# Patient Record
Sex: Female | Born: 1975 | Race: Black or African American | Hispanic: No | Smoking: Former smoker
Health system: Southern US, Community
[De-identification: ages and names within clinical notes are randomized; demographics above are authoritative.]

## PROBLEM LIST (undated history)

## (undated) DIAGNOSIS — D571 Sickle-cell disease without crisis: Secondary | ICD-10-CM

## (undated) DIAGNOSIS — F419 Anxiety disorder, unspecified: Secondary | ICD-10-CM

## (undated) DIAGNOSIS — D649 Anemia, unspecified: Secondary | ICD-10-CM

## (undated) HISTORY — PX: FOOT SURGERY: SHX648

## (undated) HISTORY — PX: OTHER SURGICAL HISTORY: SHX169

---

## 2004-06-10 ENCOUNTER — Inpatient Hospital Stay (HOSPITAL_COMMUNITY): Admission: AD | Admit: 2004-06-10 | Discharge: 2004-06-11 | Payer: Self-pay | Admitting: *Deleted

## 2004-06-10 ENCOUNTER — Emergency Department (HOSPITAL_COMMUNITY): Admission: EM | Admit: 2004-06-10 | Discharge: 2004-06-10 | Payer: Self-pay

## 2004-06-19 ENCOUNTER — Emergency Department (HOSPITAL_COMMUNITY): Admission: EM | Admit: 2004-06-19 | Discharge: 2004-06-19 | Payer: Self-pay | Admitting: Family Medicine

## 2004-09-09 ENCOUNTER — Emergency Department (HOSPITAL_COMMUNITY): Admission: EM | Admit: 2004-09-09 | Discharge: 2004-09-09 | Payer: Self-pay | Admitting: Emergency Medicine

## 2005-03-27 ENCOUNTER — Emergency Department (HOSPITAL_COMMUNITY): Admission: EM | Admit: 2005-03-27 | Discharge: 2005-03-27 | Payer: Self-pay | Admitting: Family Medicine

## 2005-06-05 ENCOUNTER — Other Ambulatory Visit: Admission: RE | Admit: 2005-06-05 | Discharge: 2005-06-05 | Payer: Self-pay | Admitting: Obstetrics and Gynecology

## 2005-06-18 ENCOUNTER — Other Ambulatory Visit: Admission: RE | Admit: 2005-06-18 | Discharge: 2005-06-18 | Payer: Self-pay | Admitting: Obstetrics and Gynecology

## 2005-11-26 ENCOUNTER — Other Ambulatory Visit: Admission: RE | Admit: 2005-11-26 | Discharge: 2005-11-26 | Payer: Self-pay | Admitting: Obstetrics and Gynecology

## 2006-03-17 ENCOUNTER — Other Ambulatory Visit: Admission: RE | Admit: 2006-03-17 | Discharge: 2006-03-17 | Payer: Self-pay | Admitting: Obstetrics and Gynecology

## 2006-06-21 ENCOUNTER — Other Ambulatory Visit: Admission: RE | Admit: 2006-06-21 | Discharge: 2006-06-21 | Payer: Self-pay | Admitting: Obstetrics and Gynecology

## 2006-10-04 ENCOUNTER — Emergency Department (HOSPITAL_COMMUNITY): Admission: EM | Admit: 2006-10-04 | Discharge: 2006-10-04 | Payer: Self-pay | Admitting: *Deleted

## 2006-10-06 ENCOUNTER — Emergency Department (HOSPITAL_COMMUNITY): Admission: EM | Admit: 2006-10-06 | Discharge: 2006-10-06 | Payer: Self-pay | Admitting: Emergency Medicine

## 2006-10-28 ENCOUNTER — Encounter: Admission: RE | Admit: 2006-10-28 | Discharge: 2006-10-28 | Payer: Self-pay | Admitting: Family Medicine

## 2007-07-22 ENCOUNTER — Inpatient Hospital Stay (HOSPITAL_COMMUNITY): Admission: AD | Admit: 2007-07-22 | Discharge: 2007-07-22 | Payer: Self-pay | Admitting: Obstetrics and Gynecology

## 2007-08-02 ENCOUNTER — Ambulatory Visit (HOSPITAL_COMMUNITY): Admission: RE | Admit: 2007-08-02 | Discharge: 2007-08-02 | Payer: Self-pay | Admitting: Obstetrics and Gynecology

## 2007-09-08 ENCOUNTER — Inpatient Hospital Stay (HOSPITAL_COMMUNITY): Admission: AD | Admit: 2007-09-08 | Discharge: 2007-09-08 | Payer: Self-pay | Admitting: Obstetrics and Gynecology

## 2007-09-09 ENCOUNTER — Ambulatory Visit: Payer: Self-pay | Admitting: Cardiology

## 2007-09-16 ENCOUNTER — Ambulatory Visit: Payer: Self-pay

## 2007-11-03 ENCOUNTER — Inpatient Hospital Stay (HOSPITAL_COMMUNITY): Admission: AD | Admit: 2007-11-03 | Discharge: 2007-11-03 | Payer: Self-pay | Admitting: Obstetrics and Gynecology

## 2007-11-15 ENCOUNTER — Inpatient Hospital Stay (HOSPITAL_COMMUNITY): Admission: AD | Admit: 2007-11-15 | Discharge: 2007-11-15 | Payer: Self-pay | Admitting: Obstetrics and Gynecology

## 2007-11-28 ENCOUNTER — Inpatient Hospital Stay (HOSPITAL_COMMUNITY): Admission: AD | Admit: 2007-11-28 | Discharge: 2007-11-28 | Payer: Self-pay | Admitting: Obstetrics and Gynecology

## 2007-12-07 ENCOUNTER — Inpatient Hospital Stay (HOSPITAL_COMMUNITY): Admission: AD | Admit: 2007-12-07 | Discharge: 2007-12-10 | Payer: Self-pay | Admitting: Obstetrics and Gynecology

## 2009-01-23 ENCOUNTER — Inpatient Hospital Stay (HOSPITAL_COMMUNITY): Admission: AD | Admit: 2009-01-23 | Discharge: 2009-01-23 | Payer: Self-pay | Admitting: Obstetrics & Gynecology

## 2010-01-09 ENCOUNTER — Encounter: Admission: RE | Admit: 2010-01-09 | Discharge: 2010-01-09 | Payer: Self-pay | Admitting: Obstetrics and Gynecology

## 2010-12-04 ENCOUNTER — Emergency Department (HOSPITAL_COMMUNITY)
Admission: EM | Admit: 2010-12-04 | Discharge: 2010-12-04 | Payer: Self-pay | Source: Home / Self Care | Admitting: Emergency Medicine

## 2010-12-05 ENCOUNTER — Ambulatory Visit (HOSPITAL_COMMUNITY)
Admission: RE | Admit: 2010-12-05 | Discharge: 2010-12-05 | Payer: Self-pay | Source: Home / Self Care | Attending: Obstetrics and Gynecology | Admitting: Obstetrics and Gynecology

## 2011-01-04 ENCOUNTER — Encounter: Payer: Self-pay | Admitting: Obstetrics and Gynecology

## 2011-02-23 LAB — DIFFERENTIAL
Basophils Absolute: 0 10*3/uL (ref 0.0–0.1)
Basophils Relative: 1 % (ref 0–1)
Eosinophils Absolute: 0.1 10*3/uL (ref 0.0–0.7)
Eosinophils Relative: 2 % (ref 0–5)
Neutrophils Relative %: 54 % (ref 43–77)

## 2011-02-23 LAB — POCT I-STAT, CHEM 8
HCT: 33 % — ABNORMAL LOW (ref 36.0–46.0)
Hemoglobin: 11.2 g/dL — ABNORMAL LOW (ref 12.0–15.0)
Potassium: 3.4 mEq/L — ABNORMAL LOW (ref 3.5–5.1)
Sodium: 137 mEq/L (ref 135–145)
TCO2: 24 mmol/L (ref 0–100)

## 2011-02-23 LAB — SURGICAL PCR SCREEN
MRSA, PCR: NEGATIVE
Staphylococcus aureus: NEGATIVE

## 2011-02-23 LAB — URINALYSIS, ROUTINE W REFLEX MICROSCOPIC
Bilirubin Urine: NEGATIVE
Glucose, UA: NEGATIVE mg/dL
Hgb urine dipstick: NEGATIVE
Specific Gravity, Urine: 1.012 (ref 1.005–1.030)
Urobilinogen, UA: 1 mg/dL (ref 0.0–1.0)

## 2011-02-23 LAB — CBC
MCH: 28.7 pg (ref 26.0–34.0)
MCHC: 34.5 g/dL (ref 30.0–36.0)
Platelets: 201 10*3/uL (ref 150–400)
RBC: 3.9 MIL/uL (ref 3.87–5.11)
RDW: 13.1 % (ref 11.5–15.5)

## 2011-02-23 LAB — WET PREP, GENITAL
Trich, Wet Prep: NONE SEEN
Yeast Wet Prep HPF POC: NONE SEEN

## 2011-02-23 LAB — RPR: RPR Ser Ql: NONREACTIVE

## 2011-02-23 LAB — GC/CHLAMYDIA PROBE AMP, GENITAL: Chlamydia, DNA Probe: NEGATIVE

## 2011-04-28 NOTE — Assessment & Plan Note (Signed)
[facility]                            CARDIOLOGY OFFICE NOTE   NAME:HEYLIGERRahcel, Natasha Huff                     MRN:          [id number]  DATE:09/09/2007                            DOB:          11-29-1976    I was asked by Dr. Dierdre Forth to evaluate and consult on [name] for chest pain and shortness of breath over the last 3 weeks.   She is about [redacted] weeks pregnant.   HISTORY OF PRESENT ILLNESS:  She is 35 years of age, has 3 children and  is pregnant. She has a history remotely of being in the hospital in [place] for low potassium and irregular heart beat. Details are not  known.   She has never had any valvular disease or rheumatic heart disease.   Over the last 3 weeks she has noticed dyspnea on exertion. She has also  had some sharp stabbing chest pain substernally.   She denies any true orthopnea, PND, but has had some peripheral edema at  the end of the day.   She saw Dr. Pennie Rushing in the [facility] yesterday. Her potassium was  3.3, hemoglobin 11.0, creatinine and renal function normal, LFTs normal  except for her albumin is a little bit low.   I do not think she eats very well.   PAST MEDICAL HISTORY:  She is not allergic to any medications.   CURRENT MEDICATIONS:  Zoloft, Zyrtec, and prenatal vitamins.   She quit smoking in February of 2008. She does not use recreational  drugs. She does not drink any alcohol right now.   Her past medical history is also significant for sickle cell trait.  Apparently her mother had sickle cell.   FAMILY HISTORY:  Negative for premature coronary disease.   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   REVIEW OF SYSTEMS:  She has had chronic anemia, allergies and hay fever,  and chronic anxiety.   PHYSICAL EXAMINATION:  Blood pressure is 112/72, her pulse is 93 and  regular. Her EKG is abnormal, expect for sinus tachycardia. The EKG from  the [facility] was  also similar. Her height is not given. Her weight  is 153.  HEENT: Normocephalic, atraumatic. PERRLA: Extraocular movements intact.  Sclera clear. Facial symmetry is normal. She is in no acute distress.  SKIN: Warm and dry.  Respiratory is 18 and unlabored.  NECK: Shows no obvious JVD. Thyroid is palpable but nontender. Trachea  is midline. Carotid upstrokes are equal bilateral without bruits.  LUNGS: Clear to auscultation.  HEART: Reveals a non displaced PMI. She has a normal S1, S2 without  click. There is a soft systolic murmur along the left sternal border. S2  splits physiologically. There is no diastolic murmur.  ABDOMINAL: Soft, good bowel sounds. She is gravida.  EXTREMITIES: Reveal really no edema. Pulses are intact.  NEURO EXAM: Grossly intact.  Skin is unremarkable.   ASSESSMENT/PLAN:  1. Dyspnea on exertion. This is most likely just secondary to anemia,      some poor nutrition, and being pregnant. She probably was not  in      great condition prior to being pregnant.  2. Hypokalemia which seems to be a chronic problem.  3. Non cardiac chest pain.   RECOMMENDATIONS:  1. Improve dietary protein.  2. Increase potassium rich foods which were reviewed.  3. A 2D echocardiogram to rule out any structural heart disease.   We will see her back in a couple of weeks to make sure she is doing  well.     Thomas C. Daleen Squibb, MD, [facility]  Electronically Signed    TCW/MedQ  DD: 09/09/2007  DT: 09/09/2007  Job #: [id number]   cc:   Hal Morales, M.D.

## 2011-04-28 NOTE — H&P (Signed)
[name], [name] NO.:  [id number]   MEDICAL RECORD NO.:  [id number]          PATIENT TYPE:  INP   LOCATION:  9166                          FACILITY:  [facility]   PHYSICIAN:  Osborn Coho, M.D.   DATE OF BIRTH:  21-Jul-1976   DATE OF ADMISSION:  12/07/2007  DATE OF DISCHARGE:                              HISTORY & PHYSICAL   This is a 35 year old gravida 4, para 3-0-0-3, at 38-1/7 weeks, who  presents for induction of labor secondary to chronic back pain  unresponsive to treatment by a spine specialist and physical therapy.  She also has suspected LGA.  Pregnancy has been followed by Dr. Su Hilt,  remarkable for:   1. Has sickle cell trait.  2. History of abnormal Pap  3. Son with closed spina bifida.  4. History of anxiety.  5. Previous smoker.   ALLERGIES:  None.   OB HISTORY:  Remarkable for vaginal delivery in 1998 of a female at 59  weeks' gestation weighing 6 pounds 14 ounces.  She had a vaginal  delivery in 1999 of a female at 24 weeks' gestation weighing 6 pounds 15  ounces.  She had a vaginal delivery in 2003 of a female at 18 weeks'  gestation weighing 6 pounds 15 ounces, remarkable for closed spina  bifida.   MEDICAL HISTORY:  1. History of anemia.  2. History of abnormal Pap.  3. Childhood varicella.  4. History of anxiety and depression.   SURGICAL HISTORY:  Remarkable for a bunion removal and nasal surgery.   FAMILY HISTORY:  Remarkable for grandfather with heart disease.  Sister  with a thromboembolism.  Aunts with thyroid problems.  Son with  seizures.  Grandfather with Alzheimer's.   GENETIC HISTORY:  Remarkable for son with spina bifida, patient with  sickle cell trait, and mother and sons with sickle cell trait.  Her  mother has sickle cell disease.  Has a family history of twins.   SOCIAL HISTORY:  The patient is engaged to [employer], who is involved  and supportive.  She does not report a religious affiliation.  She  denies any  alcohol, tobacco or drug use.   PRENATAL LABS:  Hemoglobin 12.6, platelets 229.  Blood type is O+.  Sickle cell trait positive.  RPR nonreactive.  Rubella immune.  Hepatitis negative.  HIV negative.  Pap test normal.  Cystic fibrosis  negative.  GC and chlamydia both positive at new OB.   HISTORY OF CURRENT PREGNANCY:  The patient entered care at 10 weeks'  gestation.  She had positive chlamydia and gonorrhea at new OB, which  were both treated.  She had first trimester screen that was normal.  Test of cure for [facility] and chlamydia were both negative.  Ultrasound at 18  weeks was normal except for bilateral choroid plexus cysts.  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  She had  a 2-D fetal echo at 26 weeks that was normal.  Ultrasound at 30 weeks  was normal except for growth greater  than 90th percentile.  She used  some Zoloft for her mental health issues and stopped them late in  pregnancy.  Group B strep was negative at term.  She was seen at 35  weeks for sciatic back pain.  Ultrasound was done that time showing EFW  of 99th percentile and AFI at the 90th percentile.  She presents today  for induction of labor.   OBJECTIVE:  VITAL SIGNS:  Stable, afebrile.  HEENT:  Within normal limits.  Thyroid normal and not enlarged.  CHEST:  Clear to auscultation.  HEART:  Regular rate and rhythm.  ABDOMEN:  Gravid, vertex to [place].  EFM shows reactive fetal heart rate with no contractions.  Cervix is closed to fingertip, 70%, -3, with vertex presentation.  EXTREMITIES:  Within normal limits.   ASSESSMENT:  1. Intrauterine pregnancy at 38-1/7 weeks.  2. Induction of labor for back pain and large for gestational age.   PLAN:  1. Admit to birthing suites per Dr. Su Hilt.  2. Routine MD orders.  3. Pitocin.      Natasha Huff, C.N.M.      Osborn Coho, M.D.  Electronically  Signed    MLW/MEDQ  D:  12/07/2007  T:  12/07/2007  Job:  [id number]

## 2011-05-28 ENCOUNTER — Emergency Department (HOSPITAL_COMMUNITY)
Admission: EM | Admit: 2011-05-28 | Discharge: 2011-05-28 | Disposition: A | Discharge status: Home / Self Care | Payer: No Typology Code available for payment source | Attending: Emergency Medicine | Admitting: Emergency Medicine

## 2011-05-28 DIAGNOSIS — M542 Cervicalgia: Secondary | ICD-10-CM | POA: Insufficient documentation

## 2011-05-28 DIAGNOSIS — F411 Generalized anxiety disorder: Secondary | ICD-10-CM | POA: Insufficient documentation

## 2011-05-28 DIAGNOSIS — S139XXA Sprain of joints and ligaments of unspecified parts of neck, initial encounter: Secondary | ICD-10-CM | POA: Insufficient documentation

## 2011-05-28 DIAGNOSIS — Y929 Unspecified place or not applicable: Secondary | ICD-10-CM | POA: Insufficient documentation

## 2011-05-28 DIAGNOSIS — M2560 Stiffness of unspecified joint, not elsewhere classified: Secondary | ICD-10-CM | POA: Insufficient documentation

## 2011-05-28 DIAGNOSIS — D571 Sickle-cell disease without crisis: Secondary | ICD-10-CM | POA: Insufficient documentation

## 2011-09-18 LAB — CBC
HCT: 26.9 — ABNORMAL LOW
Hemoglobin: 9.3 — ABNORMAL LOW
MCHC: 33.4
MCV: 84.3
MCV: 85
Platelets: 142 — ABNORMAL LOW
RBC: 3.29 — ABNORMAL LOW
RDW: 16.5 — ABNORMAL HIGH
RDW: 17 — ABNORMAL HIGH
WBC: 8.7

## 2011-09-18 LAB — URINALYSIS, ROUTINE W REFLEX MICROSCOPIC
Bilirubin Urine: NEGATIVE
Hgb urine dipstick: NEGATIVE
Ketones, ur: NEGATIVE
Nitrite: NEGATIVE
Urobilinogen, UA: 0.2
pH: 6.5

## 2011-09-21 LAB — URINALYSIS, ROUTINE W REFLEX MICROSCOPIC
Bilirubin Urine: NEGATIVE
Ketones, ur: NEGATIVE
Nitrite: NEGATIVE
Protein, ur: NEGATIVE
Urobilinogen, UA: 0.2
pH: 6.5

## 2011-09-21 LAB — STREP B DNA PROBE

## 2011-09-21 LAB — CBC
Platelets: 150
RBC: 3.23 — ABNORMAL LOW
WBC: 6.5

## 2011-09-22 LAB — URINALYSIS, ROUTINE W REFLEX MICROSCOPIC
Nitrite: NEGATIVE
Specific Gravity, Urine: <1.005 — ABNORMAL LOW
pH: 6

## 2011-09-22 LAB — GC/CHLAMYDIA PROBE AMP, GENITAL
Chlamydia, DNA Probe: NEGATIVE
GC Probe Amp, Genital: NEGATIVE

## 2011-09-22 LAB — FETAL FIBRONECTIN: Fetal Fibronectin: NEGATIVE

## 2011-09-22 LAB — WET PREP, GENITAL: Yeast Wet Prep HPF POC: NONE SEEN

## 2011-09-24 LAB — DIFFERENTIAL
Basophils Absolute: 0
Basophils Relative: 0
Monocytes Relative: 6
Neutro Abs: 7.3
Neutrophils Relative %: 79 — ABNORMAL HIGH

## 2011-09-24 LAB — COMPREHENSIVE METABOLIC PANEL
Alkaline Phosphatase: 59
BUN: 1 — ABNORMAL LOW
Glucose, Bld: 98
Potassium: 3.3 — ABNORMAL LOW
Total Protein: 5.8 — ABNORMAL LOW

## 2011-09-24 LAB — CBC
HCT: 31.8 — ABNORMAL LOW
Hemoglobin: 11 — ABNORMAL LOW
MCHC: 34.7
MCV: 92.5
RDW: 13.8

## 2011-09-25 LAB — TISSUE HYBRIDIZATION TO NCBH

## 2011-09-28 LAB — URINALYSIS, ROUTINE W REFLEX MICROSCOPIC
Ketones, ur: NEGATIVE
Nitrite: NEGATIVE
Protein, ur: NEGATIVE
Urobilinogen, UA: 0.2

## 2012-03-24 ENCOUNTER — Encounter: Payer: Self-pay | Admitting: Obstetrics and Gynecology

## 2012-03-24 ENCOUNTER — Other Ambulatory Visit: Payer: Self-pay | Admitting: Obstetrics and Gynecology

## 2012-03-24 ENCOUNTER — Ambulatory Visit (INDEPENDENT_AMBULATORY_CARE_PROVIDER_SITE_OTHER): Payer: PRIVATE HEALTH INSURANCE | Admitting: Obstetrics and Gynecology

## 2012-03-24 VITALS — BP 104/70 | HR 72 | Resp 16 | Ht 67.0 in | Wt 132.0 lb

## 2012-03-24 DIAGNOSIS — Z01419 Encounter for gynecological examination (general) (routine) without abnormal findings: Secondary | ICD-10-CM

## 2012-03-24 DIAGNOSIS — Z8619 Personal history of other infectious and parasitic diseases: Secondary | ICD-10-CM

## 2012-03-24 DIAGNOSIS — Z975 Presence of (intrauterine) contraceptive device: Secondary | ICD-10-CM

## 2012-03-24 DIAGNOSIS — Z124 Encounter for screening for malignant neoplasm of cervix: Secondary | ICD-10-CM

## 2012-03-24 LAB — POCT WET PREP (WET MOUNT): Trichomonas Wet Prep HPF POC: NEGATIVE

## 2012-03-24 MED ORDER — VALACYCLOVIR HCL 500 MG PO TABS: 500.0000 mg | ORAL_TABLET | Freq: Two times a day (BID) | ORAL | Status: AC | PRN

## 2012-03-24 NOTE — Progress Notes (Signed)
No complaints excepts questions if she still has BV  Filed Vitals:   03/24/12 1616  BP: 104/70  Pulse: 72  Resp: 16    General - no distress Neck - No lymphadenopathy or enlarged thyroid Chest - clear to auscultation, no wheezes, rales or rhonchi, symmetric air entry Heart - normal rate and regular rhythm Abdomen - soft, nontender, nondistended, no masses or organomegaly Breasts - breasts appear normal, no suspicious masses, no skin or nipple changes or axillary nodes Pelvic - normal external genitalia, vulva, vagina, cervix, uterus and adnexa, no abnl d/c, IUD string in place (due to come out end of year) Extremities - no edema, redness or tenderness in the calves or thighs  Results for orders placed in visit on 03/24/12  POCT WET PREP (WET MOUNT)      Component Value Range   Source Wet Prep POC vaginal     WBC, Wet Prep HPF POC       Bacteria Wet Prep HPF POC neg     BACTERIA WET PREP MORPHOLOGY POC       Clue Cells Wet Prep HPF POC None     CLUE CELLS WET PREP WHIFF POC Negative Whiff     Yeast Wet Prep HPF POC None     KOH Wet Prep POC       Trichomonas Wet Prep HPF POC neg     pH 5.0      A/P STD testing (declines HSV secondary + history) Elevated ph - trial of rephresh Recheck vit D secondary to h/o vit D deficiency Pt considering BTL or another IUD Valtrex prn for HSV 2 RTO prn or for AEX

## 2012-03-25 LAB — HEPATITIS B SURFACE ANTIGEN: Hepatitis B Surface Ag: NEGATIVE

## 2012-03-25 LAB — VITAMIN D 25 HYDROXY (VIT D DEFICIENCY, FRACTURES): Vit D, 25-Hydroxy: 29 ng/mL — ABNORMAL LOW (ref 30–89)

## 2012-03-25 LAB — HIV ANTIBODY (ROUTINE TESTING W REFLEX): HIV: NONREACTIVE

## 2012-03-25 NOTE — Progress Notes (Signed)
Addended by: Marla Roe A on: 03/25/2012 10:59 AM   Modules accepted: Orders

## 2012-04-08 ENCOUNTER — Other Ambulatory Visit: Payer: Self-pay

## 2012-04-08 ENCOUNTER — Telehealth: Payer: Self-pay

## 2012-04-08 DIAGNOSIS — A6000 Herpesviral infection of urogenital system, unspecified: Secondary | ICD-10-CM

## 2012-04-08 MED ORDER — VALACYCLOVIR HCL 500 MG PO TABS: 500.0000 mg | ORAL_TABLET | Freq: Two times a day (BID) | ORAL | Status: AC

## 2012-04-08 MED ORDER — VITAMIN D3 1.25 MG (50000 UT) PO CAPS: 1.0000 | ORAL_CAPSULE | ORAL | Status: DC

## 2012-04-08 MED ORDER — VALACYCLOVIR HCL 500 MG PO TABS: 500.0000 mg | ORAL_TABLET | Freq: Two times a day (BID) | ORAL | Status: DC

## 2012-04-08 NOTE — Telephone Encounter (Signed)
Left message for pt to call our office re: low vit- d level/protocol PG CMA

## 2012-04-08 NOTE — Telephone Encounter (Signed)
Pt. Returned call, pt was given vit-d protocol rx ordered.Pt. Also stated that she needed rx for Valtrex only had 10 days on rx that was given at time of last ov.Rx will be ordered.PG CMA

## 2012-07-04 ENCOUNTER — Other Ambulatory Visit (HOSPITAL_COMMUNITY): Payer: Self-pay | Admitting: Podiatry

## 2012-07-04 DIAGNOSIS — L02619 Cutaneous abscess of unspecified foot: Secondary | ICD-10-CM

## 2012-07-05 ENCOUNTER — Ambulatory Visit (HOSPITAL_COMMUNITY)
Admission: RE | Admit: 2012-07-05 | Discharge: 2012-07-05 | Disposition: A | Discharge status: Home / Self Care | Payer: PRIVATE HEALTH INSURANCE | Source: Ambulatory Visit | Attending: Podiatry | Admitting: Podiatry

## 2012-07-05 ENCOUNTER — Other Ambulatory Visit (HOSPITAL_COMMUNITY): Payer: Self-pay | Admitting: Podiatry

## 2012-07-05 DIAGNOSIS — L03119 Cellulitis of unspecified part of limb: Secondary | ICD-10-CM

## 2012-07-05 DIAGNOSIS — L02619 Cutaneous abscess of unspecified foot: Secondary | ICD-10-CM

## 2012-07-05 DIAGNOSIS — B957 Other staphylococcus as the cause of diseases classified elsewhere: Secondary | ICD-10-CM | POA: Insufficient documentation

## 2012-07-05 DIAGNOSIS — Z452 Encounter for adjustment and management of vascular access device: Secondary | ICD-10-CM | POA: Insufficient documentation

## 2012-07-05 MED ORDER — VANCOMYCIN HCL IN DEXTROSE 1-5 GM/200ML-% IV SOLN
1000.0000 mg | INTRAVENOUS | Status: AC
  Administered 2012-07-05: 1000 mg via INTRAVENOUS
  Filled 2012-07-05: qty 200

## 2012-07-05 MED ORDER — HEPARIN SOD (PORK) LOCK FLUSH 100 UNIT/ML IV SOLN: 250.0000 [IU] | INTRAVENOUS | Status: DC | PRN

## 2012-07-05 MED ORDER — HEPARIN SOD (PORK) LOCK FLUSH 100 UNIT/ML IV SOLN
INTRAVENOUS | Status: AC
  Administered 2012-07-05: 250 [IU] via INTRAVENOUS
  Filled 2012-07-05: qty 5

## 2012-07-05 NOTE — Procedures (Signed)
Successful image guided placement of Single lumen PICC line to right UE brachial vein. Length 40cm and tip at SVC/RA  No complications. Ready for use.  Brayton El PA-C 07/05/2012 11:15 AM

## 2012-07-06 MED FILL — Heparin Sodium (Porcine) Lock Flush IV Soln 100 Unit/ML: INTRAVENOUS | Qty: 5 | Status: CN

## 2012-07-10 ENCOUNTER — Emergency Department (HOSPITAL_COMMUNITY): Payer: PRIVATE HEALTH INSURANCE

## 2012-07-10 ENCOUNTER — Emergency Department (HOSPITAL_COMMUNITY)
Admission: EM | Admit: 2012-07-10 | Discharge: 2012-07-10 | Disposition: A | Discharge status: Home / Self Care | Payer: PRIVATE HEALTH INSURANCE | Attending: Emergency Medicine | Admitting: Emergency Medicine

## 2012-07-10 ENCOUNTER — Encounter (HOSPITAL_COMMUNITY): Payer: Self-pay | Admitting: *Deleted

## 2012-07-10 DIAGNOSIS — R110 Nausea: Secondary | ICD-10-CM | POA: Insufficient documentation

## 2012-07-10 DIAGNOSIS — R5381 Other malaise: Secondary | ICD-10-CM | POA: Insufficient documentation

## 2012-07-10 DIAGNOSIS — L270 Generalized skin eruption due to drugs and medicaments taken internally: Secondary | ICD-10-CM | POA: Insufficient documentation

## 2012-07-10 DIAGNOSIS — T50905A Adverse effect of unspecified drugs, medicaments and biological substances, initial encounter: Secondary | ICD-10-CM

## 2012-07-10 DIAGNOSIS — R5383 Other fatigue: Secondary | ICD-10-CM | POA: Insufficient documentation

## 2012-07-10 DIAGNOSIS — T368X5A Adverse effect of other systemic antibiotics, initial encounter: Secondary | ICD-10-CM | POA: Insufficient documentation

## 2012-07-10 DIAGNOSIS — Z79899 Other long term (current) drug therapy: Secondary | ICD-10-CM | POA: Insufficient documentation

## 2012-07-10 DIAGNOSIS — L299 Pruritus, unspecified: Secondary | ICD-10-CM | POA: Insufficient documentation

## 2012-07-10 HISTORY — DX: Anemia, unspecified: D64.9

## 2012-07-10 HISTORY — DX: Anxiety disorder, unspecified: F41.9

## 2012-07-10 HISTORY — DX: Sickle-cell disease without crisis: D57.1

## 2012-07-10 LAB — CBC WITH DIFFERENTIAL/PLATELET
Basophils Absolute: 0 10*3/uL (ref 0.0–0.1)
Basophils Relative: 1 % (ref 0–1)
Eosinophils Absolute: 0.2 10*3/uL (ref 0.0–0.7)
Eosinophils Relative: 4 % (ref 0–5)
HCT: 33.7 % — ABNORMAL LOW (ref 36.0–46.0)
MCH: 28.5 pg (ref 26.0–34.0)
MCHC: 35 g/dL (ref 30.0–36.0)
MCV: 81.4 fL (ref 78.0–100.0)
Monocytes Absolute: 0.3 10*3/uL (ref 0.1–1.0)
RDW: 13.1 % (ref 11.5–15.5)

## 2012-07-10 LAB — BASIC METABOLIC PANEL WITH GFR
BUN: 9 mg/dL (ref 6–23)
CO2: 23 meq/L (ref 19–32)
Calcium: 8.9 mg/dL (ref 8.4–10.5)
Chloride: 103 meq/L (ref 96–112)
Creatinine, Ser: 0.68 mg/dL (ref 0.50–1.10)
GFR calc Af Amer: >90 mL/min
GFR calc non Af Amer: >90 mL/min
Glucose, Bld: 85 mg/dL (ref 70–99)
Potassium: 3.8 meq/L (ref 3.5–5.1)
Sodium: 137 meq/L (ref 135–145)

## 2012-07-10 MED ORDER — HEPARIN SOD (PORK) LOCK FLUSH 100 UNIT/ML IV SOLN
250.0000 [IU] | INTRAVENOUS | Status: AC | PRN
  Administered 2012-07-10: 250 [IU]

## 2012-07-10 MED ORDER — SODIUM CHLORIDE 0.9 % IJ SOLN: 10.0000 mL | Freq: Two times a day (BID) | INTRAMUSCULAR | Status: DC

## 2012-07-10 MED ORDER — SODIUM CHLORIDE 0.9 % IJ SOLN
10.0000 mL | INTRAMUSCULAR | Status: DC | PRN
  Administered 2012-07-10: 10 mL

## 2012-07-10 NOTE — ED Provider Notes (Signed)
History     CSN: [id number]  Arrival date & time 07/10/12  1324   First MD Initiated Contact with Patient 07/10/12 1349      Chief Complaint  Patient presents with  . Allergic Reaction    IV vancomycin    (Consider location/radiation/quality/duration/timing/severity/associated sxs/prior treatment) HPI Comments: Patient s/p foot surgery 7/1 with subsequent staph infection. PICC placed and patient started on IV vancomycin 5 days ago. Two days ago patient developed rash on bilateral arms/hands/ankles. Described as itching and burning. Was told to d/c Vanc by her surgeon yesterday and start augmentin/bactrim. She has follow-up tomorrow. Was seen by PCP today who sent patient to ED for evaluation. No treatments prior. No alleviating or aggravating factors. Patient has had malaise, mild nausea since rash started. No fever, vomiting, diarrhea. No oral lesions.   Patient is a 36 y.o. female presenting with rash. The history is provided by the patient.  Rash  This is a new problem. The current episode started 12 to 24 hours ago. The problem has been gradually worsening. The problem is associated with new medications. There has been no fever. The pain is mild. The pain has been constant since onset. Associated symptoms include itching and pain. Pertinent negatives include no blisters and no weeping. She has tried nothing for the symptoms. Risk factors include new medications.    Past Medical History  Diagnosis Date  . Sickle cell anemia   . Anxiety   . Anemia     Past Surgical History  Procedure Date  . Foot surgery   . Cyst removed from overies     History reviewed. No pertinent family history.  History  Substance Use Topics  . Smoking status: Former Smoker    Types: Cigarettes  . Smokeless tobacco: Not on file  . Alcohol Use: 0.5 oz/week    1 drink(s) per week    OB History    Grav Para Term Preterm Abortions TAB SAB Ect Mult Living                  Review of Systems    Constitutional: Positive for fatigue. Negative for fever and chills.  HENT: Negative for sore throat and rhinorrhea.   Eyes: Negative for redness.  Respiratory: Negative for cough.   Cardiovascular: Negative for chest pain.  Gastrointestinal: Positive for nausea. Negative for vomiting, abdominal pain and diarrhea.  Genitourinary: Negative for dysuria.  Musculoskeletal: Negative for myalgias.  Skin: Positive for itching and rash.  Neurological: Negative for headaches.    Allergies  Flagyl and Vancomycin  Home Medications   Current Outpatient Rx  Name Route Sig Dispense Refill  . ALPRAZOLAM 0.5 MG PO TABS Oral Take 0.25-0.5 mg by mouth 3 (three) times daily as needed.    . AMOXICILLIN-POT CLAVULANATE 875-125 MG PO TABS Oral Take 1 tablet by mouth 2 (two) times daily.    Marland Kitchen VITAMIN D3 50000 UNITS PO CAPS Oral Take 1 capsule by mouth once a week. 20 capsule 0  . OXYCODONE-ACETAMINOPHEN 5-325 MG PO TABS Oral Take 1 tablet by mouth every 4 (four) hours as needed. For pain    . PAROXETINE HCL 10 MG PO TABS Oral Take 10 mg by mouth every morning.    Marland Kitchen PROMETHAZINE HCL 25 MG PO TABS Oral Take 25 mg by mouth every 4 (four) hours as needed.    . SULFAMETHOXAZOLE-TMP DS 800-160 MG PO TABS Oral Take 1 tablet by mouth 2 (two) times daily.    Marland Kitchen VALACYCLOVIR HCL  500 MG PO TABS Oral Take 500 mg by mouth 2 (two) times daily as needed.      BP 112/71  Pulse 82  Temp 98 F (36.7 C) (Oral)  Resp 18  SpO2 100%  LMP 07/04/2012  Physical Exam  Nursing note and vitals reviewed. Constitutional: She appears well-developed and well-nourished.  HENT:  Head: Normocephalic and atraumatic.  Mouth/Throat: Oropharynx is clear and moist. No oropharyngeal exudate.  Eyes: Conjunctivae are normal. Pupils are equal, round, and reactive to light.  Neck: Normal range of motion. Neck supple.  Cardiovascular: Normal rate and regular rhythm.   Pulmonary/Chest: Breath sounds normal. No respiratory distress.   Abdominal: Soft. There is no tenderness.  Neurological: She is alert.  Skin: Skin is warm and dry. Rash noted.       Healing wound R foot over MT of 1st digit. No redness, drainage or streaking. Patient has papular, skin colored rash that covers hands to mid-forearms bilaterally as well as ankles bilaterally. No increased warmth or erythema. Skin is rough and thick over rash.   Psychiatric: She has a normal mood and affect.    ED Course  Procedures (including critical care time)  Labs Reviewed  CBC WITH DIFFERENTIAL - Abnormal; Notable for the following:    Hemoglobin 11.8 (*)     HCT 33.7 (*)     All other components within normal limits  BASIC METABOLIC PANEL  LAB REPORT - SCANNED   Dg Foot Complete Right  07/10/2012  *RADIOLOGY REPORT*  Clinical Data: Allergic reaction, status post first metatarsal surgery 07/01/2012  RIGHT FOOT COMPLETE - 3+ VIEW  Comparison: None.  Findings: Three views of the right foot submitted.  Postsurgical changes with partial resection and metallic fixation nail noted distal aspect first metatarsal.  Mild soft tissue swelling in the region of first metatarsal phalangeal joint.  No definite bony erosion or periosteal reaction to suggest osteomyelitis.  Clinical correlation is necessary.  Further evaluation with MRI could be performed.  Mild dorsal soft tissue swelling metatarsal region.  IMPRESSION: Postsurgical changes with partial resection and metallic fixation nail noted distal aspect first metatarsal.  Mild soft tissue swelling in the region of first metatarsal phalangeal joint.  No definite bony erosion or periosteal reaction to suggest osteomyelitis.  Clinical correlation is necessary.  Further evaluation with MRI could be performed.  Mild dorsal soft tissue swelling metatarsal region.  Original Report Authenticated By: Natasha Mead, M.D.     1. Medication reaction    Patient seen and examined. Discussed with and seen with Dr. Weldon Inches. Labs/x-ray ordered.    Vital signs reviewed and are as follows: Filed Vitals:   07/10/12 1412  BP: 112/71  Pulse: 82  Temp: 98 F (36.7 C)  Resp: 18   Labs reassuring. X-ray with no definite signs of osteomyelitis.   Results reviewed with patient.   Patient instructed to discontinue vancomycin. She is to follow-up with her surgeon tomorrow.  Her exam is unchanged. Patient urged to return with worsening rash, airway involvement, fever. She verbalizes understanding and agrees with plan.     MDM  Rash -- possibly 2/2 vancomycin use. This has been discontinued since yesterday. She has not had airway or mucosal involvement. No fever. Labs normal/renal function normal. She has follow-up tomorrow. No acute interventions needed. Exam unchanged during ED evaluation. Do not suspect SJS/TEN. She is stable for discharge.        Renne Crigler, [place] 07/11/12 [phone]

## 2012-07-10 NOTE — ED Notes (Addendum)
Pt has picc line - RN to draw labs from that.  This note was documented by [facility] not [facility] reviewing pt's chart d/t pt complaint (complaint received 07/12/12)

## 2012-07-10 NOTE — ED Notes (Signed)
Pt had surgery on July 2nd, got a staph infection, was placed on IV vancomycin, after first does was little itchy but has kept taking the doses, got a rash on Friday, then Saturday woke up w/ rash worse, she called the doctor but still did infusion on Saturday, still had itching, called doctor back, stopped IV vancomycin, rash has spread to wrist and legs, was sent here by [facility] urgent care. Pt states she feels like she has the flu, feels weak and sick.

## 2012-07-10 NOTE — ED Notes (Signed)
Pt escorted to discharge window, in no distress. 

## 2012-07-10 NOTE — ED Provider Notes (Signed)
Medical screening examination/treatment/procedure(s) were conducted as a shared visit with non-physician practitioner(s) and myself.  I personally evaluated the patient during the encounter Hx of right foot infection after bunionectomy. Was getting iv vanc. Got rash on hands, vanc stopped. Present with rash on hands from pcp office.   No resp sxs or orthostatic sxs.  pe lichenified skin on hands.  Swelling and ttp over right 1st MT head.  Will check labs and xr of foot to look for signs of osteo.  Cheri Guppy, MD 07/10/12 1510

## 2012-07-10 NOTE — ED Notes (Signed)
Pt states she had R foot surgery on July 2nd, got a staph infection, sent home w/ vancomycin, has a rash to bil hands and wrist and bil feet. Pt denies pain but states "I am itching all over". Pt also states "I feel sick, like I have the flu". Boot to R foot, wound on top of R foot.

## 2012-07-10 NOTE — ED Notes (Signed)
Awaiting for IV team to come flush PICC w/ heparin

## 2012-07-12 ENCOUNTER — Encounter (HOSPITAL_COMMUNITY): Payer: Self-pay | Admitting: Emergency Medicine

## 2012-07-12 ENCOUNTER — Other Ambulatory Visit: Payer: Self-pay

## 2012-07-12 ENCOUNTER — Emergency Department (HOSPITAL_COMMUNITY)
Admission: EM | Admit: 2012-07-12 | Discharge: 2012-07-12 | Disposition: A | Discharge status: Home / Self Care | Payer: PRIVATE HEALTH INSURANCE | Attending: Emergency Medicine | Admitting: Emergency Medicine

## 2012-07-12 DIAGNOSIS — F411 Generalized anxiety disorder: Secondary | ICD-10-CM | POA: Insufficient documentation

## 2012-07-12 DIAGNOSIS — Z87891 Personal history of nicotine dependence: Secondary | ICD-10-CM | POA: Insufficient documentation

## 2012-07-12 DIAGNOSIS — M79603 Pain in arm, unspecified: Secondary | ICD-10-CM

## 2012-07-12 DIAGNOSIS — D649 Anemia, unspecified: Secondary | ICD-10-CM | POA: Insufficient documentation

## 2012-07-12 DIAGNOSIS — D571 Sickle-cell disease without crisis: Secondary | ICD-10-CM | POA: Insufficient documentation

## 2012-07-12 DIAGNOSIS — M79609 Pain in unspecified limb: Secondary | ICD-10-CM | POA: Insufficient documentation

## 2012-07-12 NOTE — ED Notes (Signed)
Pt right upper arm assessed.  No tenderness or warmth noted.  Pt reports "a little tender only"

## 2012-07-12 NOTE — ED Notes (Signed)
Pt verbalizes understanding that per md, no vascular F/U is needed.  Vascular tech notified but not available here in ED.  Dr Oletta Lamas made aware.  Pt states "I only came in because your charge RN Macan told me to come in, I spoke with my home Nurse and he said everything looked fine"

## 2012-07-12 NOTE — ED Provider Notes (Signed)
History     CSN: [id number]  Arrival date & time 07/12/12  1829   First MD Initiated Contact with Patient 07/12/12 1909      Chief Complaint  Patient presents with  . Vascular Access Problem    PICC line to RUA    (Consider location/radiation/quality/duration/timing/severity/associated sxs/prior treatment) HPI Comments: Patient has a PICC line in her right upper extremity which she was receiving vancomycin for a staph infection. She was seen 2 days ago do to a red rash that occurred following vancomycin administration. She was d him to have a vancomycin reaction. During her ED evaluation however, the patient had a tourniquet placed around her right arm in order for her blood tests to be drawn. Patient plus a friend/family member in the room reports that the tourniquet was in place long enough for the nurse to waist a tube of blood and then obtain a second tube of blood. The patient did have some significant discomfort in the area of the tourniquet during that time. Later at home she discussed with her home health care nurse as well as friends who reported that she probably should not had a tourniquet in that area. The patient reports that she continues to have some focal discomfort in that same area and did have some skin discoloration, perhaps a bruise. She denies any radiation of pain down her arm. She denies any significant swelling. She denies any numbness, tingling, weakness. She reports no pain in the right shoulder or right side of her neck. She denies chest pain or shortness of breath or pleurisy. She reports that she did speak to someone here at the hospital and was told that if she was concerned that she could come here for further evaluation. She reports the pain in that local area of her right upper extremity has improved since it began on Sunday but is still present.  The history is provided by the patient.    Past Medical History  Diagnosis Date  . Sickle cell anemia   . Anxiety    . Anemia     Past Surgical History  Procedure Date  . Foot surgery   . Cyst removed from overies     History reviewed. No pertinent family history.  History  Substance Use Topics  . Smoking status: Former Smoker    Types: Cigarettes  . Smokeless tobacco: Not on file  . Alcohol Use: 0.5 oz/week    1 drink(s) per week    OB History    Grav Para Term Preterm Abortions TAB SAB Ect Mult Living                  Review of Systems  Constitutional: Negative for fever and chills.  HENT: Negative for neck pain.   Respiratory: Negative for chest tightness.   Cardiovascular: Negative for chest pain, palpitations and leg swelling.  Musculoskeletal: Positive for arthralgias.  Skin: Positive for color change. Negative for pallor, rash and wound.  Neurological: Negative for weakness and numbness.    Allergies  Flagyl and Vancomycin  Home Medications   Current Outpatient Rx  Name Route Sig Dispense Refill  . ALPRAZOLAM 0.5 MG PO TABS Oral Take 0.25-0.5 mg by mouth 3 (three) times daily as needed. Anxiety.    . CHOLECALCIFEROL 50000 UNITS PO CAPS Oral Take 50,000 Units by mouth once a week.    . CEFEPIME PEDIATRIC IVPB >1000 </=2000 MG Intravenous Inject 50 mg/kg into the vein every 12 (twelve) hours. Infuse over 10 minutes.    Marland Kitchen  OXYCODONE-ACETAMINOPHEN 5-325 MG PO TABS Oral Take 1 tablet by mouth every 4 (four) hours as needed. For pain    . PAROXETINE HCL 10 MG PO TABS Oral Take 10 mg by mouth every morning.    Marland Kitchen PROMETHAZINE HCL 25 MG PO TABS Oral Take 25 mg by mouth every 4 (four) hours as needed. For nausea with pain medicine.    Marland Kitchen VALACYCLOVIR HCL 500 MG PO TABS Oral Take 500 mg by mouth 2 (two) times daily as needed. For outbreak.      BP 117/70  Pulse 86  Temp 99 F (37.2 C) (Oral)  Resp 20  SpO2 100%  LMP 07/04/2012  Physical Exam  Nursing note and vitals reviewed. Constitutional: She appears well-developed and well-nourished. No distress.  Eyes: Pupils are  equal, round, and reactive to light.  Neck: Normal range of motion. Neck supple.  Cardiovascular: Normal rate.   Pulmonary/Chest: Effort normal. No respiratory distress.  Musculoskeletal:       Arms: Neurological: She is alert.  Skin: Skin is warm and dry. No rash noted. No erythema. No pallor.    ED Course  Procedures (including critical care time)  Labs Reviewed - No data to display No results found.   1. Arm pain       MDM   Clinially I see no signs and I do not think a tourniquet for 1 minute would cause any sig damage or cause a DVT.  Pt reports that it is still working.          Gavin Pound. Oletta Lamas, MD 07/12/12 2031

## 2012-07-12 NOTE — ED Notes (Signed)
Pt c/o of pain and swelling at the PICC line area. States that nurse accessed PICC line on Sunday and placed a tourniquet around it to draw blood. States that since she has had symptoms of swelling and pain at site.

## 2012-07-12 NOTE — ED Notes (Signed)
Pt verbalizes understanding 

## 2012-07-12 NOTE — Discharge Instructions (Signed)
 Watch your arm for worsening pain or swelling, discoloration, numbness or weakness.  Otherwise ibuprofen  and heat may improve symptoms and help promote healing.

## 2012-07-12 NOTE — ED Notes (Signed)
Pt verbalizes understanding, per md no need to f/u with vascular.  Pt reports " I only came in

## 2012-07-12 NOTE — ED Notes (Addendum)
Spoke to pt re a complaint she had during her visit Sunday.  She reported that a nurse in white scrubs who was relieving her primary nurse came in to draw blood from her PICC line.  She states that the nurse proceeded to place a tourniquet around her R upper arm where her PICC line is located.  She reported that she had told the nurse that her [facility] nurse does not normally use tourniquet or does not apply tourniquet around her R arm when drawing blood, and she also told the nurse that she was instructed to not have her BP checked on this same arm.  She states that the nurse told her that she's done this at her old job in the past.  Pt reported that the nurse asked her if she needed to be flushed after blood draw, pt reported that that is when she became worried that the nurse did not know what she was doing.  When pt's [facility] nurse arrived at her house post-visit to the ED.  She reported asking her [facility] nurse re use of tourniquet around her R arm when drawing blood and was told "no".  She reported that her R arm is bruised and is sore from where the tourniquet was placed.  Pt states "I wanted to make this complaint because i wanted to make sure that this will not happen to other patients."  Pt made aware that this recorder will be reporting this complaint to the leadership team.  Pt requested for someone to contact her re this issue.  Spoke to the leadership team ([place], [place] and [place] along with Dennison Mascot) re pt complaint. Advised to have pt return to the ED today to have her R arm evaluated for blood clot.  Pt notified and expressed the concern about an ED bill if she returned to be evaluated, she was notified that the leadership team will put in a request that she will not be billed.  She states that she does want to be evaluated because she's been through so much.  She told this recorder that she will try to come in tonight, she's instructed to call the charge phone to notify the charge nurse when she is  on her way to the ED.  Charge number given to pt.

## 2012-07-12 NOTE — ED Notes (Signed)
UJW:[id number]<YN> Expected date:07/12/12<BR> Expected time:<BR> Means of arrival:<BR> Comments:<BR> Hold for Heylinger

## 2012-07-13 NOTE — ED Provider Notes (Signed)
Medical screening examination/treatment/procedure(s) were performed by non-physician practitioner and as supervising physician I was immediately available for consultation/collaboration.  Cheri Guppy, MD 07/13/12 0005

## 2012-07-28 ENCOUNTER — Encounter (HOSPITAL_BASED_OUTPATIENT_CLINIC_OR_DEPARTMENT_OTHER): Payer: PRIVATE HEALTH INSURANCE

## 2012-09-01 ENCOUNTER — Encounter (HOSPITAL_BASED_OUTPATIENT_CLINIC_OR_DEPARTMENT_OTHER): Payer: PRIVATE HEALTH INSURANCE

## 2012-09-02 ENCOUNTER — Encounter: Payer: Self-pay | Admitting: Obstetrics and Gynecology

## 2012-09-02 ENCOUNTER — Ambulatory Visit (INDEPENDENT_AMBULATORY_CARE_PROVIDER_SITE_OTHER): Payer: PRIVATE HEALTH INSURANCE | Admitting: Obstetrics and Gynecology

## 2012-09-02 VITALS — BP 90/62 | Resp 14 | Ht 68.0 in | Wt 130.0 lb

## 2012-09-02 DIAGNOSIS — Z113 Encounter for screening for infections with a predominantly sexual mode of transmission: Secondary | ICD-10-CM

## 2012-09-02 DIAGNOSIS — Z30433 Encounter for removal and reinsertion of intrauterine contraceptive device: Secondary | ICD-10-CM

## 2012-09-02 DIAGNOSIS — Z3009 Encounter for other general counseling and advice on contraception: Secondary | ICD-10-CM

## 2012-09-02 DIAGNOSIS — Z975 Presence of (intrauterine) contraceptive device: Secondary | ICD-10-CM

## 2012-09-02 DIAGNOSIS — Z3049 Encounter for surveillance of other contraceptives: Secondary | ICD-10-CM

## 2012-09-02 MED ORDER — LEVONORGESTREL 20 MCG/24HR IU IUD
INTRAUTERINE_SYSTEM | Freq: Once | INTRAUTERINE | Status: AC
  Administered 2012-09-02: 1 via INTRAUTERINE

## 2012-09-02 NOTE — Progress Notes (Signed)
  IUD: Mirena LOT#: [id number] exp. 12/2014 Exp: 12/2014 UPT: negative GC/CHLAMYDIA: negative Not done CONSENT SIGNED: yes DISINFECTION WITH betadine X3 UTERUS SOUNDED AT 7.5 CM IUD INSERTED PER PROTOCOL: yes COMPLICATION: none PATIENT INSTRUCTED TO CALL IS FEVER OR ABNORMAL PAIN:yes PATIENT INSTRUCTED ON HOW TO CHECK IUD STRINGS: yes FOLLOW UP APPT: 5wks  IUD removed without difficulty prior to reinsertion of new Mirena

## 2012-10-04 ENCOUNTER — Encounter: Payer: Self-pay | Admitting: Obstetrics and Gynecology

## 2012-10-04 ENCOUNTER — Encounter: Payer: PRIVATE HEALTH INSURANCE | Admitting: Obstetrics and Gynecology

## 2012-10-04 ENCOUNTER — Ambulatory Visit (INDEPENDENT_AMBULATORY_CARE_PROVIDER_SITE_OTHER): Payer: PRIVATE HEALTH INSURANCE | Admitting: Obstetrics and Gynecology

## 2012-10-04 VITALS — BP 108/72 | Resp 16 | Ht 68.0 in | Wt 130.0 lb

## 2012-10-04 DIAGNOSIS — Z30431 Encounter for routine checking of intrauterine contraceptive device: Secondary | ICD-10-CM

## 2012-10-04 NOTE — Progress Notes (Signed)
Here for f/u iud insertion.  No complaints.  Filed Vitals:   10/04/12 0912  BP: 108/72  Resp: 16   ROS: noncontributory  Pelvic exam:  VULVA: normal appearing vulva with no masses, tenderness or lesions,  VAGINA: normal appearing vagina with normal color and discharge, no lesions, CERVIX: normal appearing cervix without discharge or lesions, string visible UTERUS: uterus is normal size, shape, consistency and nontender,  ADNEXA: normal adnexa in size, nontender and no masses.  A/P May for AEX

## 2012-11-30 ENCOUNTER — Telehealth: Payer: Self-pay | Admitting: Obstetrics and Gynecology

## 2012-11-30 NOTE — Telephone Encounter (Signed)
Returned pt's call regarding request for BV med and Boric Acid. Pt was advised that she needed an appt. Pt stated that she does not have insurance right now.  Rx was called in for Boric Acid 600 mg Sig 1 pv twice wkly prn #30 rf 32yr per [facility] CNM. Pt was advised that Boric Acid will not cure BV. Pt voice understanding. Mathis Bud

## 2014-03-27 ENCOUNTER — Encounter (HOSPITAL_COMMUNITY): Payer: Self-pay | Admitting: Pharmacist

## 2014-03-27 ENCOUNTER — Other Ambulatory Visit: Payer: Self-pay | Admitting: Obstetrics and Gynecology

## 2014-03-28 ENCOUNTER — Encounter (HOSPITAL_COMMUNITY): Payer: Self-pay | Admitting: *Deleted

## 2014-03-29 ENCOUNTER — Other Ambulatory Visit (HOSPITAL_COMMUNITY): Payer: Self-pay | Admitting: Obstetrics and Gynecology

## 2014-03-29 NOTE — H&P (Signed)
Natasha Huff is a 38 y.o.  female P 4-0-0-4 presents tubal sterilization because of her desire no longer preserve childbearing potential.  Patient currently is using the Mirena IUD but believes that it causes her to have an unpleasant vaginal discharge.  She has been amenorrheic as a result of her IUD but prior to its use, her menses lasted for 4 days and were light with no cramping.  She has also used oral contraceptives and the Depo Provera injection in the past for contraception but at this juncture would like permanent sterilization.  Past Medical History  OB History: G:  4   P: 4-0-0-4;  SVB: 1998, 2000, 2004 and 2009  GYN History: menarche: 38 YO    LMP: none due to Mirena IUD   Contracepton: Mirena IUD History of HSV-2 & HPV:   History of abnormal PAP smear in 2007 that was treated with cryotherapy;  Last PAP smear: 04/01/14-normal  Medical History: Anxiety, MRSA (right foot 2013)  Surgical History: 2000 Rhinoplasty,   2007 Cervical Cryosurgery,  2011 Laparoscopic Ovarian Cystectomy and 2013 Bilateral Bunionectomy Denies problems with anesthesia or history of blood transfusions  Family History: Seizures, Asthma, Anemia, Bipolar Disorder, Sickle Cell Disease  Social History: 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 Flagyl [Metronidazole Hcl]   . Vancomycin Rash    Denies sensitivity to peanuts, shellfish, soy, latex or adhesives.  ROS: Admits to glasses and left neck pain but  denies headache, vision changes, nasal congestion, dysphagia, tinnitus, dizziness, hoarseness, cough,  chest pain, shortness of breath, nausea, vomiting, diarrhea,constipation,  urinary frequency, urgency  dysuria, hematuria, vaginitis symptoms, pelvic pain, swelling of joints,easy bruising,  arthralgias,  skin rashes, unexplained weight loss and except as is mentioned in the history of present illness, patient's review of systems is otherwise negative.  Physical Exam  Bp: 90/64   P:  72  R:14  Temperature 98.4 degrees F orally  Weight: 143 lbs.  Height: 5\' 7"   BMI: 22.4  Neck: supple without masses or thyromegaly Lungs: clear to auscultation Heart: regular rate and rhythm Abdomen: soft, non-tender and no organomegaly Pelvic:EGBUS- wnl; vagina-normal rugae; uterus-normal size, cervix without lesions or motion tenderness; adnexae-no tenderness or masses Extremities:  no clubbing, cyanosis or edema  Assesment: Desire for Permanent Sterilzation   Disposition:  A discussion was held with patient regarding the indication for her procedure(s) along with the risks, which include but are not limited to: reaction to anesthesia, damage to adjacent organs, infection and excessive bleeding. The patient verbalized understanding of these risks and that the procedure is a permanent one and has consented [facility].    CSN# [id number]   Siddalee Vanderheiden J. Lowell GuitarPowell, PA-C  for Dr. Woodroe ModeAngela Y. Su Hiltoberts

## 2014-04-06 ENCOUNTER — Ambulatory Visit (HOSPITAL_COMMUNITY)
Admission: RE | Admit: 2014-04-06 | Discharge: 2014-04-06 | Disposition: A | Discharge status: Home / Self Care | Payer: BC Managed Care – PPO | Source: Ambulatory Visit | Attending: Obstetrics and Gynecology | Admitting: Obstetrics and Gynecology

## 2014-04-06 ENCOUNTER — Encounter (HOSPITAL_COMMUNITY)
Admission: RE | Disposition: A | Discharge status: Home / Self Care | Payer: Self-pay | Source: Ambulatory Visit | Attending: Obstetrics and Gynecology

## 2014-04-06 ENCOUNTER — Ambulatory Visit (HOSPITAL_COMMUNITY): Payer: BC Managed Care – PPO | Admitting: Anesthesiology

## 2014-04-06 ENCOUNTER — Encounter (HOSPITAL_COMMUNITY): Payer: BC Managed Care – PPO | Admitting: Anesthesiology

## 2014-04-06 DIAGNOSIS — Z302 Encounter for sterilization: Secondary | ICD-10-CM | POA: Insufficient documentation

## 2014-04-06 DIAGNOSIS — F411 Generalized anxiety disorder: Secondary | ICD-10-CM | POA: Insufficient documentation

## 2014-04-06 DIAGNOSIS — N898 Other specified noninflammatory disorders of vagina: Secondary | ICD-10-CM | POA: Insufficient documentation

## 2014-04-06 DIAGNOSIS — N83209 Unspecified ovarian cyst, unspecified side: Secondary | ICD-10-CM | POA: Insufficient documentation

## 2014-04-06 DIAGNOSIS — Z87891 Personal history of nicotine dependence: Secondary | ICD-10-CM | POA: Insufficient documentation

## 2014-04-06 HISTORY — PX: LAPAROSCOPIC TUBAL LIGATION: SHX1937

## 2014-04-06 LAB — CBC
HEMATOCRIT: 37.7 % (ref 36.0–46.0)
Hemoglobin: 13.4 g/dL (ref 12.0–15.0)
MCH: 30.5 pg (ref 26.0–34.0)
MCHC: 35.5 g/dL (ref 30.0–36.0)
MCV: 85.9 fL (ref 78.0–100.0)
PLATELETS: 200 10*3/uL (ref 150–400)
RBC: 4.39 MIL/uL (ref 3.87–5.11)
RDW: 13.3 % (ref 11.5–15.5)
WBC: 6.8 10*3/uL (ref 4.0–10.5)

## 2014-04-06 LAB — PREGNANCY, URINE: Preg Test, Ur: NEGATIVE

## 2014-04-06 SURGERY — LIGATION, FALLOPIAN TUBE, LAPAROSCOPIC
Anesthesia: General | Site: Abdomen | Laterality: Bilateral

## 2014-04-06 MED ORDER — NEOSTIGMINE METHYLSULFATE 1 MG/ML IJ SOLN
INTRAMUSCULAR | Status: DC | PRN
  Administered 2014-04-06: 1.25 mg via INTRAVENOUS

## 2014-04-06 MED ORDER — FENTANYL CITRATE 0.05 MG/ML IJ SOLN
INTRAMUSCULAR | Status: AC
  Filled 2014-04-06: qty 2

## 2014-04-06 MED ORDER — FENTANYL CITRATE 0.05 MG/ML IJ SOLN
INTRAMUSCULAR | Status: AC
  Filled 2014-04-06: qty 5

## 2014-04-06 MED ORDER — GLYCOPYRROLATE 0.2 MG/ML IJ SOLN
INTRAMUSCULAR | Status: AC
  Filled 2014-04-06: qty 3

## 2014-04-06 MED ORDER — FENTANYL CITRATE 0.05 MG/ML IJ SOLN
INTRAMUSCULAR | Status: DC | PRN
  Administered 2014-04-06: 25 ug via INTRAVENOUS
  Administered 2014-04-06: 50 ug via INTRAVENOUS
  Administered 2014-04-06: 25 ug via INTRAVENOUS

## 2014-04-06 MED ORDER — DEXAMETHASONE SODIUM PHOSPHATE 10 MG/ML IJ SOLN
INTRAMUSCULAR | Status: DC | PRN
  Administered 2014-04-06: 5 mg via INTRAVENOUS

## 2014-04-06 MED ORDER — MIDAZOLAM HCL 2 MG/2ML IJ SOLN
INTRAMUSCULAR | Status: DC | PRN
  Administered 2014-04-06: 2 mg via INTRAVENOUS

## 2014-04-06 MED ORDER — KETOROLAC TROMETHAMINE 30 MG/ML IJ SOLN
INTRAMUSCULAR | Status: AC
  Filled 2014-04-06: qty 1

## 2014-04-06 MED ORDER — OXYCODONE-ACETAMINOPHEN 5-325 MG PO TABS
1.0000 | ORAL_TABLET | ORAL | Status: AC | PRN
  Administered 2014-04-06: 2 via ORAL

## 2014-04-06 MED ORDER — OXYCODONE-ACETAMINOPHEN 5-325 MG PO TABS
1.0000 | ORAL_TABLET | Freq: Four times a day (QID) | ORAL | Status: AC | PRN
Start: 2014-04-06 — End: ?

## 2014-04-06 MED ORDER — NEOSTIGMINE METHYLSULFATE 1 MG/ML IJ SOLN
INTRAMUSCULAR | Status: AC
  Filled 2014-04-06: qty 1

## 2014-04-06 MED ORDER — GLYCOPYRROLATE 0.2 MG/ML IJ SOLN
INTRAMUSCULAR | Status: DC | PRN
  Administered 2014-04-06: .2 mg via INTRAVENOUS

## 2014-04-06 MED ORDER — ROCURONIUM BROMIDE 100 MG/10ML IV SOLN
INTRAVENOUS | Status: DC | PRN
  Administered 2014-04-06 (×2): 5 mg via INTRAVENOUS
  Administered 2014-04-06: 25 mg via INTRAVENOUS

## 2014-04-06 MED ORDER — LACTATED RINGERS IV SOLN
INTRAVENOUS | Status: DC
  Administered 2014-04-06 (×2): via INTRAVENOUS

## 2014-04-06 MED ORDER — BUPIVACAINE HCL (PF) 0.25 % IJ SOLN
INTRAMUSCULAR | Status: AC
  Filled 2014-04-06: qty 30

## 2014-04-06 MED ORDER — LIDOCAINE HCL (CARDIAC) 20 MG/ML IV SOLN
INTRAVENOUS | Status: DC | PRN
Start: 2014-04-06 — End: 2014-04-06
  Administered 2014-04-06: 30 mg via INTRAVENOUS

## 2014-04-06 MED ORDER — ONDANSETRON HCL 4 MG/2ML IJ SOLN
INTRAMUSCULAR | Status: AC
  Filled 2014-04-06: qty 2

## 2014-04-06 MED ORDER — MEPERIDINE HCL 25 MG/ML IJ SOLN: 6.2500 mg | INTRAMUSCULAR | Status: DC | PRN

## 2014-04-06 MED ORDER — KETOROLAC TROMETHAMINE 30 MG/ML IJ SOLN
15.0000 mg | Freq: Once | INTRAMUSCULAR | Status: AC | PRN
  Administered 2014-04-06: 30 mg via INTRAVENOUS

## 2014-04-06 MED ORDER — MIDAZOLAM HCL 2 MG/2ML IJ SOLN
INTRAMUSCULAR | Status: AC
  Filled 2014-04-06: qty 2

## 2014-04-06 MED ORDER — FENTANYL CITRATE 0.05 MG/ML IJ SOLN
25.0000 ug | INTRAMUSCULAR | Status: DC | PRN
Start: 2014-04-06 — End: 2014-04-06
  Administered 2014-04-06: 25 ug via INTRAVENOUS

## 2014-04-06 MED ORDER — OXYCODONE-ACETAMINOPHEN 5-325 MG PO TABS
ORAL_TABLET | ORAL | Status: AC
  Filled 2014-04-06: qty 2

## 2014-04-06 MED ORDER — DEXAMETHASONE SODIUM PHOSPHATE 10 MG/ML IJ SOLN
INTRAMUSCULAR | Status: AC
  Filled 2014-04-06: qty 1

## 2014-04-06 MED ORDER — PHENYLEPHRINE HCL 10 MG/ML IJ SOLN
INTRAMUSCULAR | Status: DC | PRN
  Administered 2014-04-06 (×3): 40 ug via INTRAVENOUS

## 2014-04-06 MED ORDER — ONDANSETRON HCL 4 MG/2ML IJ SOLN
INTRAMUSCULAR | Status: DC | PRN
Start: 2014-04-06 — End: 2014-04-06
  Administered 2014-04-06: 4 mg via INTRAVENOUS

## 2014-04-06 MED ORDER — ROCURONIUM BROMIDE 100 MG/10ML IV SOLN
INTRAVENOUS | Status: AC
  Filled 2014-04-06: qty 1

## 2014-04-06 MED ORDER — IBUPROFEN 600 MG PO TABS: 600.0000 mg | ORAL_TABLET | Freq: Four times a day (QID) | ORAL | Status: AC | PRN

## 2014-04-06 MED ORDER — METOCLOPRAMIDE HCL 5 MG/ML IJ SOLN
10.0000 mg | Freq: Once | INTRAMUSCULAR | Status: AC | PRN
  Administered 2014-04-06: 10 mg via INTRAVENOUS

## 2014-04-06 MED ORDER — PROPOFOL 10 MG/ML IV BOLUS
INTRAVENOUS | Status: DC | PRN
  Administered 2014-04-06: 150 mg via INTRAVENOUS

## 2014-04-06 MED ORDER — LIDOCAINE HCL (CARDIAC) 20 MG/ML IV SOLN
INTRAVENOUS | Status: AC
  Filled 2014-04-06: qty 5

## 2014-04-06 MED ORDER — METOCLOPRAMIDE HCL 5 MG/ML IJ SOLN
INTRAMUSCULAR | Status: AC
  Filled 2014-04-06: qty 2

## 2014-04-06 MED ORDER — PROPOFOL 10 MG/ML IV EMUL
INTRAVENOUS | Status: AC
  Filled 2014-04-06: qty 20

## 2014-04-06 MED ORDER — BUPIVACAINE HCL (PF) 0.25 % IJ SOLN
INTRAMUSCULAR | Status: DC | PRN
  Administered 2014-04-06: 17 mL

## 2014-04-06 SURGICAL SUPPLY — 19 items
ADH SKN CLS LQ APL DERMABOND (GAUZE/BANDAGES/DRESSINGS) ×2
CATH ROBINSON RED A/P 16FR (CATHETERS) ×2 IMPLANT
CHLORAPREP W/TINT 26ML (MISCELLANEOUS) ×2 IMPLANT
CLOTH BEACON ORANGE TIMEOUT ST (SAFETY) ×2 IMPLANT
DERMABOND ADHESIVE PROPEN (GAUZE/BANDAGES/DRESSINGS) ×2
DERMABOND ADVANCED .7 DNX6 (GAUZE/BANDAGES/DRESSINGS) ×2 IMPLANT
GLOVE BIO SURGEON STRL SZ7.5 (GLOVE) ×2 IMPLANT
GLOVE BIOGEL PI IND STRL 7.5 (GLOVE) ×1 IMPLANT
GLOVE BIOGEL PI INDICATOR 7.5 (GLOVE) ×1
GOWN STRL REUS W/TWL LRG LVL3 (GOWN DISPOSABLE) ×4 IMPLANT
NEEDLE HYPO 25X1 1.5 SAFETY (NEEDLE) ×2 IMPLANT
NEEDLE INSUFFLATION 120MM (ENDOMECHANICALS) ×2 IMPLANT
PACK LAPAROSCOPY BASIN (CUSTOM PROCEDURE TRAY) ×2 IMPLANT
SUT MON AB 4-0 PS1 27 (SUTURE) ×2 IMPLANT
SUT VICRYL 0 UR6 27IN ABS (SUTURE) ×2 IMPLANT
TOWEL OR 17X24 6PK STRL BLUE (TOWEL DISPOSABLE) ×4 IMPLANT
TROCAR XCEL NON-BLD 11X100MML (ENDOMECHANICALS) ×2 IMPLANT
WARMER LAPAROSCOPE (MISCELLANEOUS) ×2 IMPLANT
WATER STERILE IRR 1000ML POUR (IV SOLUTION) ×2 IMPLANT

## 2014-04-06 NOTE — Op Note (Signed)
Preop Diagnosis: 1.Desires Permanent Sterilization  Postop Diagnosis: 1.Desires Permanent Sterilization 2.Right Ovarian Cyst  Procedure: 1.LAPAROSCOPIC BILATERAL SALPINGECTOMY 2.CYST ASPIRATION  Anesthesia: General   Attending: Purcell NailsAngela Y Shahla Betsill, MD   Assistant:  Scrub Tech  Findings: Normal appearing bilateral ovaries and tubes  Pathology: Bilateral tubes  Fluids: See Flowsheet  UOP: 100 cc via straight cath prior to procedure  EBL: Minimal   Complications: None  Procedure:The patient was taken to the operating room after the risks, benefits, alternatives, complications, treatment options, and expected outcomes were discussed with the patient. The patient verbalized understanding, the patient concurred with the proposed plan and consent signed and witnessed. The patient was taken to Operating Room 4, identified as Lucas Heyliger  and procedure verified as sterilization procedure via laparoscopic bilateral salpingectomy. A Time Out was held and the above information confirmed.  The patient was placed under general anesthesia per anesthesia staff, the patient was placed in modified dorsal lithotomy position and was prepped, draped, and catheterized in the normal, sterile fashion.  The cervix was visualized and an intrauterine manipulator was placed. A  10 mm umbilical incision was then performed. Veress needle was passed and pneumoperitoneum was established. A 10 mm trocar was advanced into the intraabdominal cavity, the laparoscope was introduced and findings as noted above.  A 5mm incision was made suprapubically and 5mm trocar advanced into the intraabdominal cavity under direct visualization.  The same was done in the LLQ.  The right fallopian tube was identified and carried out to its fimbriated end and excised with the Gyrus tripolar.  The same was done on the contralateral side.  A needle tip cyst aspirator was introduced into abdomen and yellow tinged clear fluid was  aspirated from 2 simple cysts of the right ovary and sent to cytology.  The laparoscope was removed and pneumoperitoneum released.  The fascia was repaired with an interrupted stitch of 0 vicryl at the umbilicus and the skin was reapproximated with 4-0 monocryl via subcuticular stitch.  Dermabond was applied to close the 5mm incisions and used to reinforce the 10mm umbilical incision as well.  Sponge, lap and needle count were correct.  Patient tolerated the procedure well and was returned to the recovery room in good condition.

## 2014-04-06 NOTE — Anesthesia Postprocedure Evaluation (Signed)
  Anesthesia Post-op Note  Anesthesia Post Note  Patient: Natasha Huff  Procedure(s) Performed: Procedure(s) (LRB): LAPAROSCOPIC TUBAL LIGATION (Bilateral)  Anesthesia type: General  Patient location: PACU  Post pain: Pain level controlled  Post assessment: Post-op Vital signs reviewed  Last Vitals:  Filed Vitals:   04/06/14 1330  BP: 100/70  Pulse: 65  Temp: 36.5 C  Resp: 19    Post vital signs: Reviewed  Level of consciousness: sedated  Complications: No apparent anesthesia complications

## 2014-04-06 NOTE — Discharge Instructions (Signed)
Laparoscopic Tubal Ligation Care After Refer to this sheet in the next few weeks. These instructions provide you with information on caring for yourself after your procedure. Your caregiver may also give you more specific instructions. Your treatment has been planned according to current medical practices, but problems sometimes occur. Call your caregiver if you have any problems or questions after your procedure. HOME CARE INSTRUCTIONS   Rest the remainder of the day.  Next dose of ibuprofen after 6:15pm on 04/06/14.  Only take over-the-counter or prescription medicines for pain, discomfort, or fever as directed by your caregiver. Do not take aspirin. It can cause bleeding.  Gradually resume daily activities, diet, rest, driving, and work.  Avoid sexual intercourse for 2 weeks or as directed.  Do not use tampons or douche.  Do not drive while taking pain medicine.  Do not lift anything over 5 pounds for 2 weeks or as directed.  Only take showers, not baths, until you are seen by your caregiver.  Change bandages (dressings) as directed.  Take your temperature twice a day and record it.  Try to have help for the first 7 to 10 days for your household needs.  Return to your caregiver to get your stitches (sutures) removed and for follow-up visits as directed. SEEK MEDICAL CARE IF:   You have redness, swelling, or increasing pain in a wound.  You have drainage from a wound lasting longer than 1 day.  Your pain is getting worse.  You have a rash.  You become dizzy or lightheaded.  You have a reaction to your medicine.  You need stronger medicine or a change in your pain medicine.  You notice a bad smell coming from a wound or dressing.  Your wound breaks open after the sutures have been removed.  You are constipated. SEEK IMMEDIATE MEDICAL CARE IF:   You faint.  You have a fever.  You have increasing abdominal pain.  You have severe pain in your shoulders.  You  have bleeding or drainage from the suture sites or vagina following surgery.  You have shortness of breath or difficulty breathing.  You have chest or leg pain.  You have persistent nausea, vomiting, or diarrhea. MAKE SURE YOU:   Understand these instructions.  Watch your condition.  Get help right away if you are not doing well or get worse. Document Released: 06/19/2005 Document Revised: 05/31/2012 Document Reviewed: 03/12/2012 [facility] Patient Information 2014 [employer] [place].

## 2014-04-06 NOTE — Interval H&P Note (Signed)
History and Physical Interval Note:  04/06/2014 10:13 AM  Natasha Huff  has presented today for surgery, with the diagnosis of Desires Surgical Sterilization  The various methods of treatment have been discussed with the patient and family. After consideration of risks, benefits and other options for treatment, the patient has consented to  Procedure(s): LAPAROSCOPIC TUBAL LIGATION/FULGURATION (Bilateral) BILATERAL SALPINGECTOMY as a surgical intervention .  The patient's history has been reviewed, patient examined, no change in status, stable for surgery.  I have reviewed the patient's chart and labs.  Questions were answered to the patient's satisfaction.     Purcell NailsAngela Y Tajai Suder

## 2014-04-06 NOTE — H&P (View-Only) (Signed)
Natasha Huff is a 38 y.o.  female P 4-0-0-4 presents tubal sterilization because of her desire no longer preserve childbearing potential.  Patient currently is using the Mirena IUD but believes that it causes her to have an unpleasant vaginal discharge.  She has been amenorrheic as a result of her IUD but prior to its use, her menses lasted for 4 days and were light with no cramping.  She has also used oral contraceptives and the Depo Provera injection in the past for contraception but at this juncture would like permanent sterilization.  Past Medical History  OB History: G:  4   P: 4-0-0-4;  SVB: 1998, 2000, 2004 and 2009  GYN History: menarche: 38 YO    LMP: none due to Mirena IUD   Contracepton: Mirena IUD History of HSV-2 & HPV:   History of abnormal PAP smear in 2007 that was treated with cryotherapy;  Last PAP smear: 04/01/14-normal  Medical History: Anxiety, MRSA (right foot 2013)  Surgical History: 2000 Rhinoplasty,   2007 Cervical Cryosurgery,  2011 Laparoscopic Ovarian Cystectomy and 2013 Bilateral Bunionectomy Denies problems with anesthesia or history of blood transfusions  Family History: Seizures, Asthma, Anemia, Bipolar Disorder, Sickle Cell Disease  Social History: Divorced and employed as a researcher; Denies tobacco use but consumes alcohol on occasion  Medicatiions: Valtrex 500 mg daily Buspirone 10 mg  daily Boric Acid Vaginal Capsules 600 mg twice weekly per vagina  Allergies  Allergen Reactions  . Flagyl [Metronidazole Hcl]   . Vancomycin Rash    Denies sensitivity to peanuts, shellfish, soy, latex or adhesives.  ROS: Admits to glasses and left neck pain but  denies headache, vision changes, nasal congestion, dysphagia, tinnitus, dizziness, hoarseness, cough,  chest pain, shortness of breath, nausea, vomiting, diarrhea,constipation,  urinary frequency, urgency  dysuria, hematuria, vaginitis symptoms, pelvic pain, swelling of joints,easy bruising,  arthralgias,  skin rashes, unexplained weight loss and except as is mentioned in the history of present illness, patient's review of systems is otherwise negative.  Physical Exam  Bp: 90/64   P:  72  R:14  Temperature 98.4 degrees F orally  Weight: 143 lbs.  Height: 5' 7"  BMI: 22.4  Neck: supple without masses or thyromegaly Lungs: clear to auscultation Heart: regular rate and rhythm Abdomen: soft, non-tender and no organomegaly Pelvic:EGBUS- wnl; vagina-normal rugae; uterus-normal size, cervix without lesions or motion tenderness; adnexae-no tenderness or masses Extremities:  no clubbing, cyanosis or edema  Assesment: Desire for Permanent Sterilzation   Disposition:  A discussion was held with patient regarding the indication for her procedure(s) along with the risks, which include but are not limited to: reaction to anesthesia, damage to adjacent organs, infection and excessive bleeding. The patient verbalized understanding of these risks and that the procedure is a permanent one and has consented to proceed with Bilateral Tubal Sterilization at [facility] on April 06, 2013.    CSN# [id number]   Chrishana Spargur J. Doniesha Landau, PA-C  for Dr. Angela Y. Roberts    

## 2014-04-06 NOTE — Anesthesia Preprocedure Evaluation (Signed)
Anesthesia Evaluation  Patient identified by MRN, date of birth, ID band Patient awake    Reviewed: Allergy & Precautions, H&P , NPO status , Patient's Chart, lab work & pertinent test results, reviewed documented beta blocker date and time   History of Anesthesia Complications Negative for: history of anesthetic complications  Airway Mallampati: I TM Distance: >3 FB Neck ROM: full    Dental  (+) Teeth Intact   Pulmonary neg pulmonary ROS, former smoker,  breath sounds clear to auscultation  Pulmonary exam normal       Cardiovascular Exercise Tolerance: Good negative cardio ROS  Rhythm:regular Rate:Normal     Neuro/Psych negative neurological ROS  negative psych ROS   GI/Hepatic negative GI ROS, Neg liver ROS,   Endo/Other  negative endocrine ROS  Renal/GU negative Renal ROS  negative genitourinary   Musculoskeletal   Abdominal   Peds  Hematology negative hematology ROS (+)   Anesthesia Other Findings Bilateral nipple piercings - unable to remove.  Discussed risk of burn.  Reproductive/Obstetrics negative OB ROS                           Anesthesia Physical Anesthesia Plan  ASA: II  Anesthesia Plan: General ETT   Post-op Pain Management:    Induction:   Airway Management Planned:   Additional Equipment:   Intra-op Plan:   Post-operative Plan:   Informed Consent: I have reviewed the patients History and Physical, chart, labs and discussed the procedure including the risks, benefits and alternatives for the proposed anesthesia with the patient or authorized representative who has indicated his/her understanding and acceptance.   Dental Advisory Given  Plan Discussed with: CRNA and Surgeon  Anesthesia Plan Comments:         Anesthesia Quick Evaluation

## 2014-04-06 NOTE — Interval H&P Note (Signed)
History and Physical Interval Note:  04/06/2014 10:12 AM  Natasha Huff  has presented today for surgery, with the diagnosis of Desires Surgical Sterilization  The various methods of treatment have been discussed with the patient and family. After consideration of risks, benefits and other options for treatment, the patient has consented to  Procedure(s): LAPAROSCOPIC TUBAL LIGATION (Bilateral) as a surgical intervention .  The patient's history has been reviewed, patient examined, no change in status, stable for surgery.  I have reviewed the patient's chart and labs.  Questions were answered to the patient's satisfaction.     Purcell Nailsngela Y Sagan Maselli  Pt wants IUD removed after trip in May.

## 2014-04-06 NOTE — Transfer of Care (Signed)
Immediate Anesthesia Transfer of Care Note  Patient: Natasha Huff  Procedure(s) Performed: Procedure(s): LAPAROSCOPIC TUBAL LIGATION (Bilateral)  Patient Location: PACU  Anesthesia Type:General  Level of Consciousness: awake, alert  and oriented  Airway & Oxygen Therapy: Patient Spontanous Breathing and Patient connected to nasal cannula oxygen  Post-op Assessment: Report given to PACU RN and Post -op Vital signs reviewed and stable  Post vital signs: Reviewed and stable  Complications: No apparent anesthesia complications

## 2014-04-09 ENCOUNTER — Encounter (HOSPITAL_COMMUNITY): Payer: Self-pay | Admitting: Obstetrics and Gynecology

## 2016-07-07 ENCOUNTER — Ambulatory Visit: Payer: PRIVATE HEALTH INSURANCE | Admitting: Family Medicine

## 2018-02-21 ENCOUNTER — Other Ambulatory Visit: Payer: Self-pay | Admitting: Obstetrics and Gynecology

## 2018-02-21 DIAGNOSIS — N946 Dysmenorrhea, unspecified: Secondary | ICD-10-CM

## 2018-02-21 DIAGNOSIS — Z113 Encounter for screening for infections with a predominantly sexual mode of transmission: Secondary | ICD-10-CM | POA: Diagnosis not present

## 2018-02-21 DIAGNOSIS — N926 Irregular menstruation, unspecified: Secondary | ICD-10-CM | POA: Diagnosis not present

## 2018-02-24 ENCOUNTER — Other Ambulatory Visit: Payer: PRIVATE HEALTH INSURANCE

## 2018-02-25 ENCOUNTER — Ambulatory Visit
Admission: RE | Admit: 2018-02-25 | Discharge: 2018-02-25 | Disposition: A | Discharge status: Home / Self Care | Payer: PRIVATE HEALTH INSURANCE | Source: Ambulatory Visit | Attending: Obstetrics and Gynecology | Admitting: Obstetrics and Gynecology

## 2018-02-25 DIAGNOSIS — N946 Dysmenorrhea, unspecified: Secondary | ICD-10-CM | POA: Diagnosis not present

## 2018-02-28 DIAGNOSIS — Z09 Encounter for follow-up examination after completed treatment for conditions other than malignant neoplasm: Secondary | ICD-10-CM | POA: Diagnosis not present

## 2018-02-28 DIAGNOSIS — N800 Endometriosis of uterus: Secondary | ICD-10-CM | POA: Diagnosis not present

## 2018-02-28 DIAGNOSIS — M545 Low back pain: Secondary | ICD-10-CM | POA: Diagnosis not present

## 2018-03-03 DIAGNOSIS — L218 Other seborrheic dermatitis: Secondary | ICD-10-CM | POA: Diagnosis not present

## 2018-03-03 DIAGNOSIS — L810 Postinflammatory hyperpigmentation: Secondary | ICD-10-CM | POA: Diagnosis not present

## 2018-03-31 DIAGNOSIS — M533 Sacrococcygeal disorders, not elsewhere classified: Secondary | ICD-10-CM | POA: Diagnosis not present

## 2018-04-14 ENCOUNTER — Other Ambulatory Visit: Payer: Self-pay | Admitting: Obstetrics and Gynecology

## 2018-04-14 DIAGNOSIS — N946 Dysmenorrhea, unspecified: Secondary | ICD-10-CM | POA: Diagnosis not present

## 2018-04-14 DIAGNOSIS — N898 Other specified noninflammatory disorders of vagina: Secondary | ICD-10-CM | POA: Diagnosis not present

## 2018-04-14 DIAGNOSIS — N920 Excessive and frequent menstruation with regular cycle: Secondary | ICD-10-CM | POA: Diagnosis not present

## 2018-04-15 ENCOUNTER — Other Ambulatory Visit: Payer: Self-pay | Admitting: Obstetrics and Gynecology

## 2018-05-11 ENCOUNTER — Other Ambulatory Visit (HOSPITAL_COMMUNITY): Payer: BLUE CROSS/BLUE SHIELD

## 2018-05-13 ENCOUNTER — Encounter (HOSPITAL_COMMUNITY): Admission: RE | Admit: 2018-05-13 | Payer: BLUE CROSS/BLUE SHIELD | Source: Ambulatory Visit

## 2018-05-26 ENCOUNTER — Other Ambulatory Visit: Payer: Self-pay | Admitting: Obstetrics and Gynecology

## 2018-05-26 ENCOUNTER — Ambulatory Visit (HOSPITAL_COMMUNITY)
Admission: AD | Admit: 2018-05-26 | Payer: BLUE CROSS/BLUE SHIELD | Source: Ambulatory Visit | Admitting: Obstetrics and Gynecology

## 2018-05-26 DIAGNOSIS — N800 Endometriosis of uterus: Secondary | ICD-10-CM | POA: Diagnosis not present

## 2018-05-26 DIAGNOSIS — N920 Excessive and frequent menstruation with regular cycle: Secondary | ICD-10-CM | POA: Diagnosis not present

## 2018-05-26 SURGERY — HYSTERECTOMY, VAGINAL
Anesthesia: General | Laterality: Bilateral

## 2018-06-08 DIAGNOSIS — N898 Other specified noninflammatory disorders of vagina: Secondary | ICD-10-CM | POA: Diagnosis not present

## 2018-07-05 ENCOUNTER — Other Ambulatory Visit: Payer: Self-pay | Admitting: Obstetrics and Gynecology

## 2018-07-13 DIAGNOSIS — Z111 Encounter for screening for respiratory tuberculosis: Secondary | ICD-10-CM | POA: Diagnosis not present

## 2018-10-13 ENCOUNTER — Telehealth: Payer: Self-pay | Admitting: Family Medicine

## 2018-10-13 NOTE — Telephone Encounter (Signed)
Copied from CRM [id number]. Topic: Appointment Scheduling - New Patient >> Oct 13, 2018  9:18 AM Terisa Starr wrote: New patient has been scheduled for your office. Provider: Dr French Ana McLean-Scocuzza Date of Appointment: 11/30/2018  Route to department's PEC pool.

## 2018-10-14 DIAGNOSIS — Z23 Encounter for immunization: Secondary | ICD-10-CM | POA: Diagnosis not present

## 2018-11-07 IMAGING — US US PELVIS COMPLETE TRANSABD/TRANSVAG
1 series · 13 of 25 positions shown · non-contrast
Comparison: 12/04/2010

CLINICAL DATA: Dysmenorrhea.  LMP 02/17/2018.

EXAM:
TRANSABDOMINAL AND TRANSVAGINAL ULTRASOUND OF PELVIS
TECHNIQUE: Both transabdominal and transvaginal ultrasound examinations of the
pelvis were performed. Transabdominal technique was performed for
global imaging of the pelvis including uterus, ovaries, adnexal
regions, and pelvic cul-de-sac. It was necessary to proceed with
endovaginal exam following the transabdominal exam to visualize the
endometrial stripe and ovaries.

[Series 1: us pelvis complete transabd/transvag · 0.11mm/px · 13 of 87 slices shown]
[im 1/87]
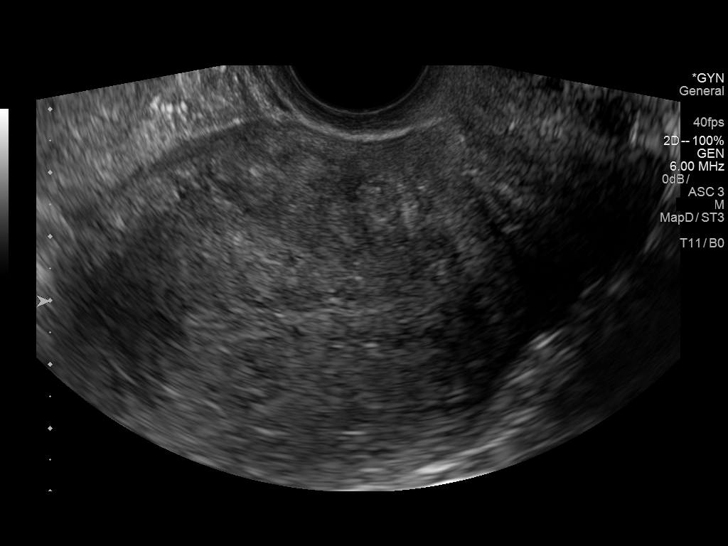
[im 8/87]
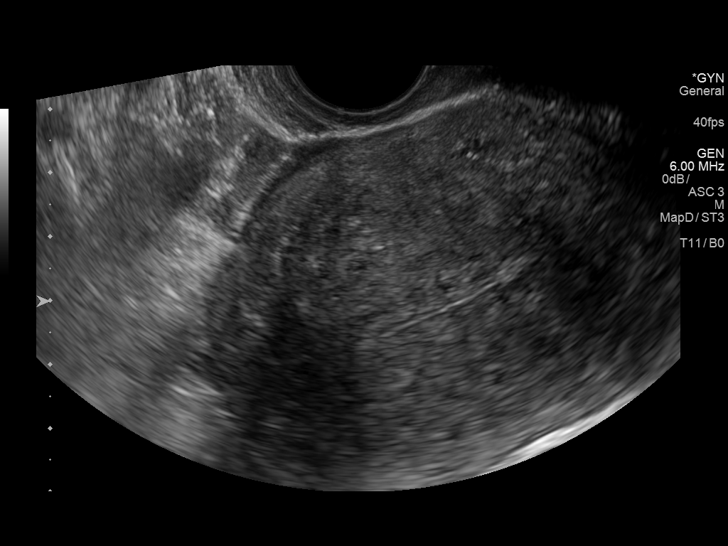
[im 15/87]
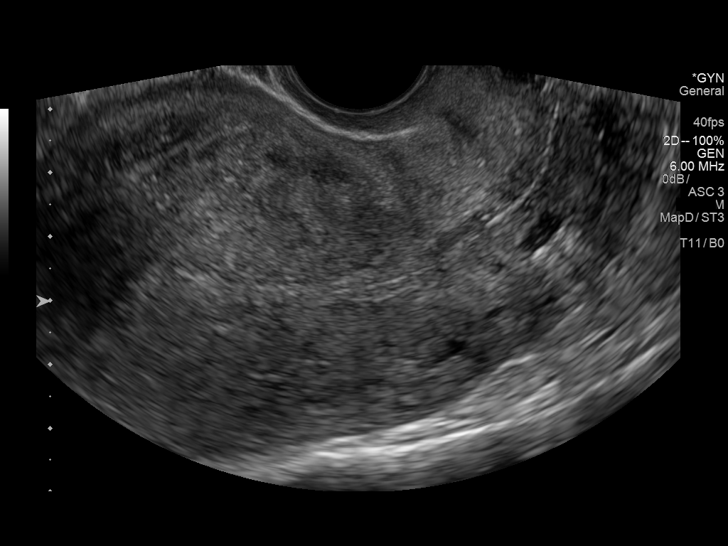
[im 22/87]
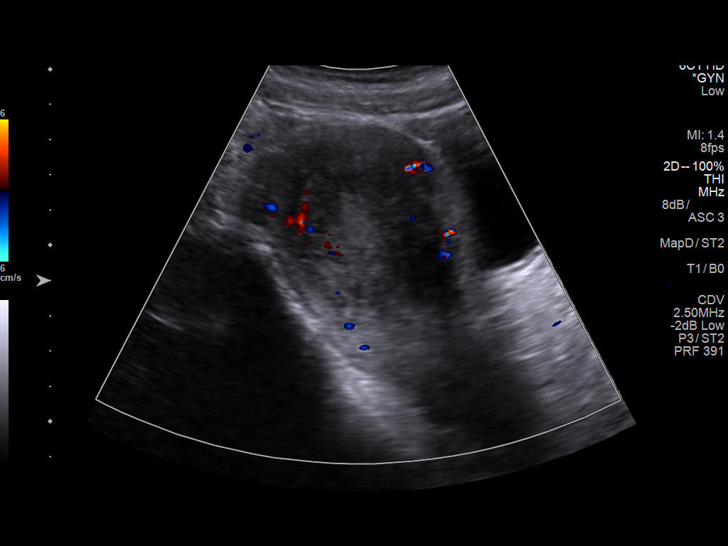
[im 29/87]
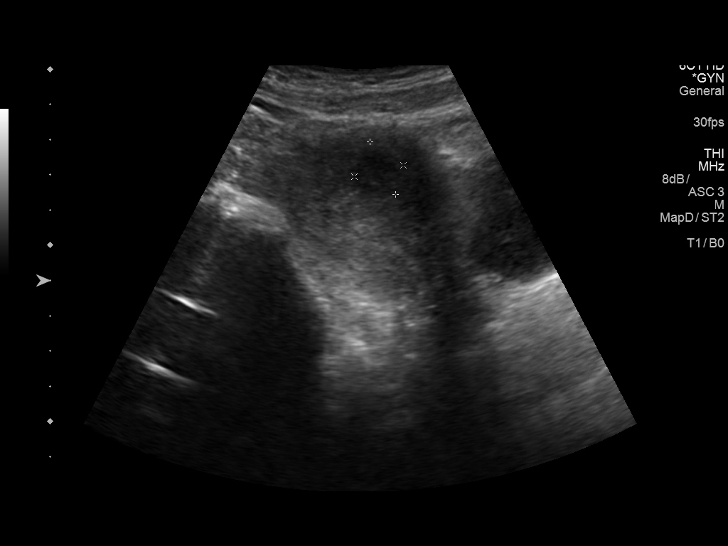
[im 36/87]
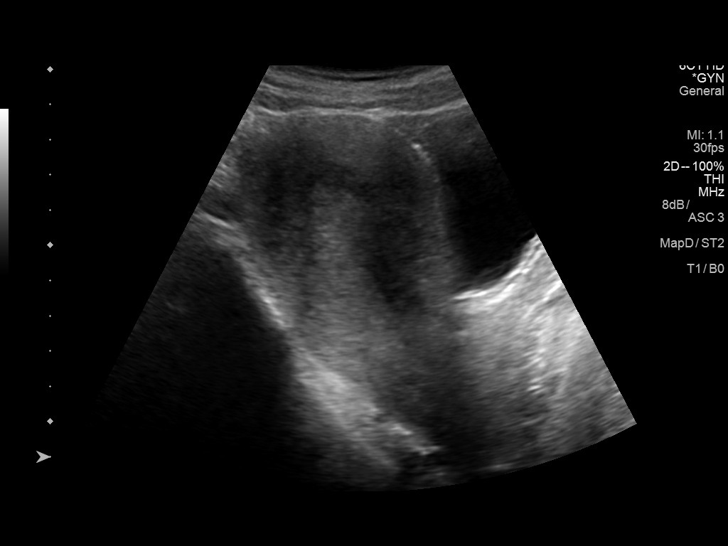
[im 44/87]
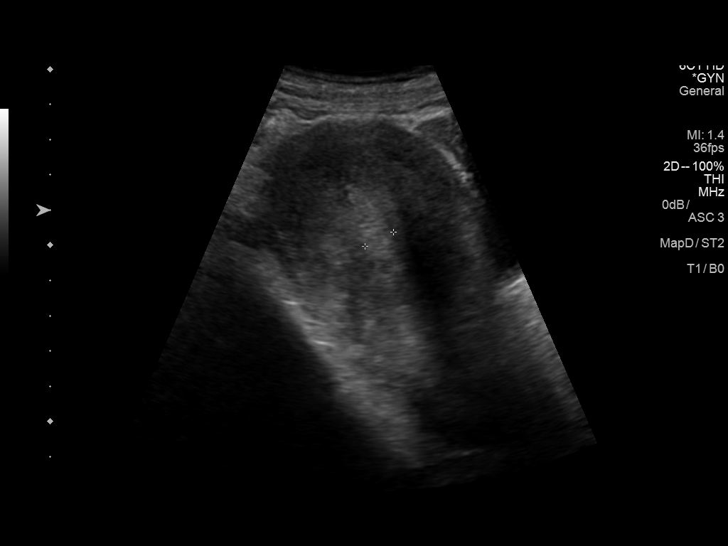
[im 51/87]
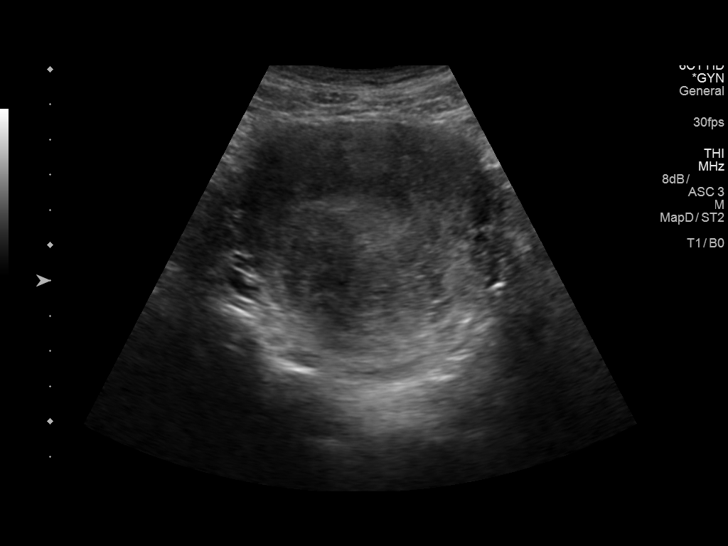
[im 58/87]
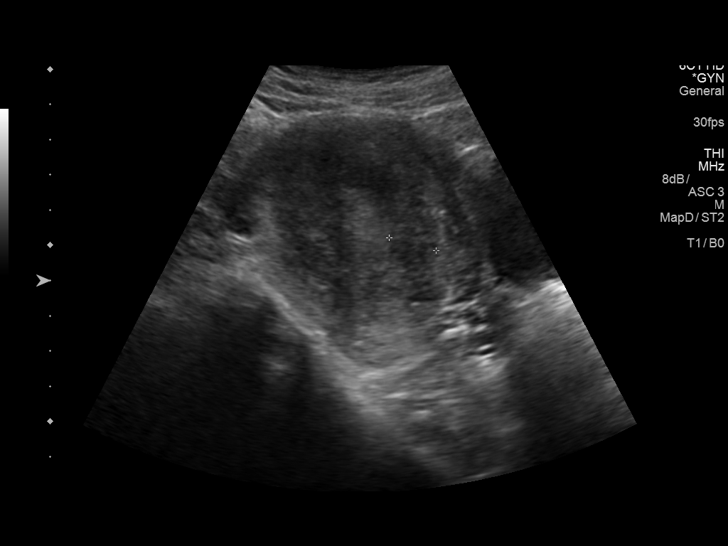
[im 65/87]
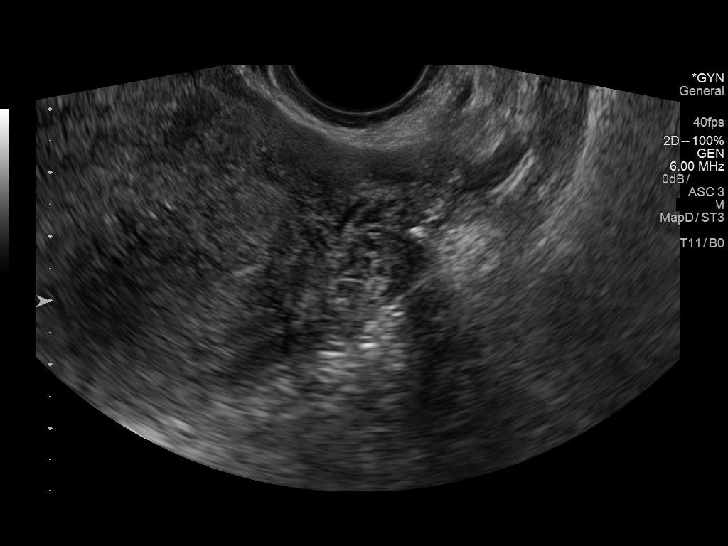
[im 72/87]
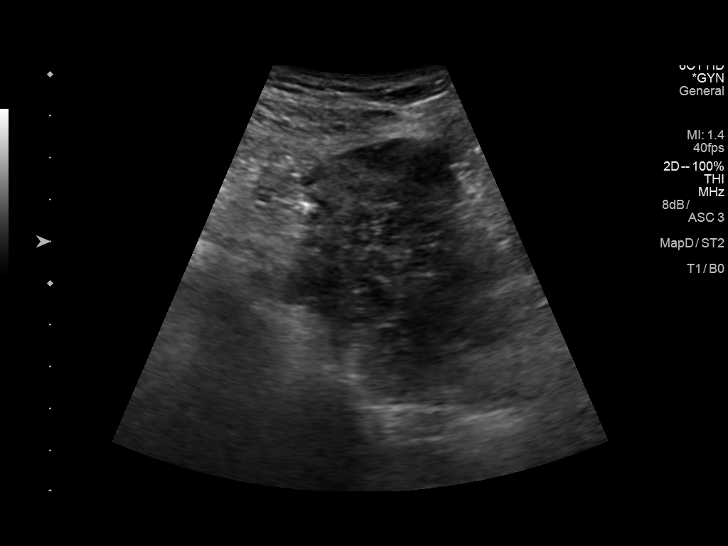
[im 79/87]
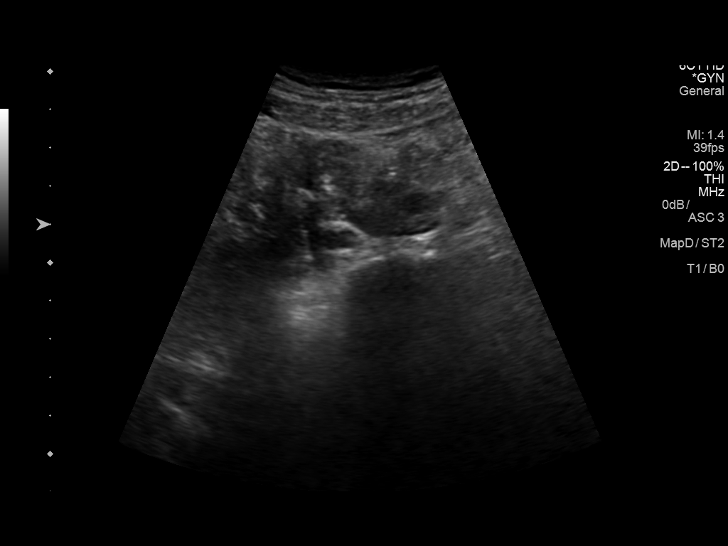
[im 87/87]
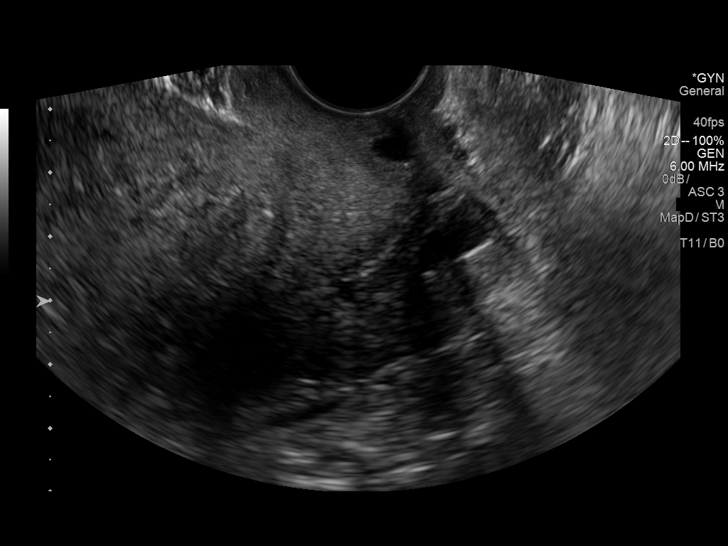

[13 of 25 positions shown; findings below may reference images not displayed]

FINDINGS: Uterus

Measurements: 11.1 x 6.5 x 7.5 cm. Diffusely heterogeneous
echogenicity of myometrium noted. This is suspicious for
adenomyosis. A subtle 1.8 cm hypoechoic mass is seen in the anterior
upper uterine corpus, suspicious for a small fibroid.

Endometrium

Thickness: 9 mm.  No focal abnormality visualized.

Right ovary

Measurements: 3.7 x 1.6 x 2.4 cm. Normal appearance/no adnexal mass.

Left ovary

Measurements: 3.8 x 1.4 x 2.9 cm. Normal appearance/no adnexal mass.

Other findings

No abnormal free fluid.
IMPRESSION: Probable diffuse uterine adenomyosis and small anterior uterine
fibroid. If clinically warranted, pelvic MRI without contrast could
be performed for further evaluation.

Normal appearance of both ovaries.  No adnexal mass identified.

## 2018-11-30 ENCOUNTER — Ambulatory Visit: Payer: Self-pay | Admitting: Internal Medicine

## 2018-11-30 DIAGNOSIS — Z0289 Encounter for other administrative examinations: Secondary | ICD-10-CM

## 2018-12-06 DIAGNOSIS — Z79899 Other long term (current) drug therapy: Secondary | ICD-10-CM | POA: Diagnosis not present

## 2018-12-22 DIAGNOSIS — D2371 Other benign neoplasm of skin of right lower limb, including hip: Secondary | ICD-10-CM | POA: Diagnosis not present

## 2019-01-04 DIAGNOSIS — R350 Frequency of micturition: Secondary | ICD-10-CM | POA: Diagnosis not present

## 2019-01-04 DIAGNOSIS — E559 Vitamin D deficiency, unspecified: Secondary | ICD-10-CM | POA: Diagnosis not present

## 2019-01-04 DIAGNOSIS — N898 Other specified noninflammatory disorders of vagina: Secondary | ICD-10-CM | POA: Diagnosis not present

## 2019-01-04 DIAGNOSIS — Z113 Encounter for screening for infections with a predominantly sexual mode of transmission: Secondary | ICD-10-CM | POA: Diagnosis not present

## 2019-02-08 ENCOUNTER — Telehealth: Payer: Self-pay

## 2019-02-08 NOTE — Telephone Encounter (Signed)
Copied from CRM [id number]. Topic: Complaint - Billing/Coding >> Feb 08, 2019  4:38 PM Arlyss Gandy, NT wrote: DOS: 11/30/18 Details of complaint: Pt would like to see if her No show fee for being 3 mins late for her appt. She states she had taken off work and was very upset that she could not be seen the day of her appt. She would like to talk with the office manager.  How would the patient like to see this issue resolved? No show fee waived    Will automatically be routed to [facility] Coding pool.

## 2019-02-09 NOTE — Telephone Encounter (Signed)
The patient's information has been sent to charge correction to have the fee removed.

## 2019-06-15 DIAGNOSIS — L853 Xerosis cutis: Secondary | ICD-10-CM | POA: Diagnosis not present

## 2019-06-15 DIAGNOSIS — L84 Corns and callosities: Secondary | ICD-10-CM | POA: Diagnosis not present

## 2019-06-15 DIAGNOSIS — L738 Other specified follicular disorders: Secondary | ICD-10-CM | POA: Diagnosis not present

## 2019-07-11 DIAGNOSIS — Z720 Tobacco use: Secondary | ICD-10-CM | POA: Diagnosis not present

## 2019-07-11 DIAGNOSIS — F419 Anxiety disorder, unspecified: Secondary | ICD-10-CM | POA: Diagnosis not present

## 2019-09-21 DIAGNOSIS — Z1231 Encounter for screening mammogram for malignant neoplasm of breast: Secondary | ICD-10-CM | POA: Diagnosis not present

## 2019-10-02 ENCOUNTER — Other Ambulatory Visit: Payer: Self-pay

## 2019-10-02 DIAGNOSIS — Z20822 Contact with and (suspected) exposure to covid-19: Secondary | ICD-10-CM

## 2019-10-04 LAB — NOVEL CORONAVIRUS, NAA: SARS-CoV-2, NAA: NOT DETECTED

## 2019-10-18 DIAGNOSIS — N6002 Solitary cyst of left breast: Secondary | ICD-10-CM | POA: Diagnosis not present

## 2019-10-18 DIAGNOSIS — R928 Other abnormal and inconclusive findings on diagnostic imaging of breast: Secondary | ICD-10-CM | POA: Diagnosis not present

## 2019-11-21 DIAGNOSIS — Z23 Encounter for immunization: Secondary | ICD-10-CM | POA: Diagnosis not present

## 2019-11-21 DIAGNOSIS — N760 Acute vaginitis: Secondary | ICD-10-CM | POA: Diagnosis not present

## 2019-11-21 DIAGNOSIS — R350 Frequency of micturition: Secondary | ICD-10-CM | POA: Diagnosis not present

## 2019-11-21 DIAGNOSIS — B9689 Other specified bacterial agents as the cause of diseases classified elsewhere: Secondary | ICD-10-CM | POA: Diagnosis not present

## 2019-11-23 DIAGNOSIS — Z03818 Encounter for observation for suspected exposure to other biological agents ruled out: Secondary | ICD-10-CM | POA: Diagnosis not present

## 2019-11-23 DIAGNOSIS — Z20828 Contact with and (suspected) exposure to other viral communicable diseases: Secondary | ICD-10-CM | POA: Diagnosis not present

## 2019-11-23 DIAGNOSIS — Z9189 Other specified personal risk factors, not elsewhere classified: Secondary | ICD-10-CM | POA: Diagnosis not present

## 2019-11-27 DIAGNOSIS — Z20828 Contact with and (suspected) exposure to other viral communicable diseases: Secondary | ICD-10-CM | POA: Diagnosis not present

## 2019-11-27 DIAGNOSIS — R05 Cough: Secondary | ICD-10-CM | POA: Diagnosis not present

## 2019-11-27 DIAGNOSIS — R5383 Other fatigue: Secondary | ICD-10-CM | POA: Diagnosis not present

## 2019-11-27 DIAGNOSIS — R52 Pain, unspecified: Secondary | ICD-10-CM | POA: Diagnosis not present

## 2019-11-27 DIAGNOSIS — R519 Headache, unspecified: Secondary | ICD-10-CM | POA: Diagnosis not present

## 2019-12-28 ENCOUNTER — Other Ambulatory Visit: Payer: Self-pay | Admitting: Cardiology

## 2019-12-28 DIAGNOSIS — Z20822 Contact with and (suspected) exposure to covid-19: Secondary | ICD-10-CM

## 2019-12-29 LAB — NOVEL CORONAVIRUS, NAA: SARS-CoV-2, NAA: NOT DETECTED

## 2020-02-01 ENCOUNTER — Ambulatory Visit: Payer: Self-pay

## 2020-02-05 ENCOUNTER — Ambulatory Visit: Payer: Self-pay | Attending: Internal Medicine

## 2020-02-05 DIAGNOSIS — Z23 Encounter for immunization: Secondary | ICD-10-CM | POA: Insufficient documentation

## 2020-02-05 NOTE — Progress Notes (Signed)
   Covid-19 Vaccination Clinic  Name:  Aleenah Homen    MRN: [id number] DOB: 07-12-1976  02/05/2020  Ms. Nelly Rout was observed post Covid-19 immunization for 15 minutes without incidence. She was provided with Vaccine Information Sheet and instruction to access the V-Safe system.   Ms. Nelly Rout was instructed to call 911 with any severe reactions post vaccine: Marland Kitchen Difficulty breathing  . Swelling of your face and throat  . A fast heartbeat  . A bad rash all over your body  . Dizziness and weakness    Immunizations Administered    Name Date Dose VIS Date Route   Moderna COVID-19 Vaccine 02/05/2020  3:25 PM 0.5 mL 11/14/2019 Intramuscular   Manufacturer: Moderna   Lot: [id number]   NDC: 00180-970-44

## 2020-03-05 ENCOUNTER — Ambulatory Visit: Payer: Self-pay | Attending: Family

## 2020-03-05 DIAGNOSIS — Z23 Encounter for immunization: Secondary | ICD-10-CM

## 2020-03-05 NOTE — Progress Notes (Signed)
   [facility] Vaccination [facility]  Name:  Deirdra Heumann    MRN: [id number] DOB: October 11, 1976  03/05/2020  Ms. Nelly Rout was observed post Covid-19 immunization for 15 minutes without incident. She was provided with Vaccine Information Sheet and instruction to access the V-Safe system.   Ms. Nelly Rout was instructed to call 911 with any severe reactions post vaccine: Marland Kitchen Difficulty breathing  . Swelling of face and throat  . A fast heartbeat  . A bad rash all over body  . Dizziness and weakness   Immunizations Administered    Name Date Dose VIS Date Route   Moderna COVID-19 Vaccine 03/05/2020  4:22 PM 0.5 mL 11/14/2019 Intramuscular   Manufacturer: Moderna   Lot: [id number]   NDC: 29518-841-66

## 2020-03-19 ENCOUNTER — Ambulatory Visit: Payer: Self-pay

## 2020-04-02 DIAGNOSIS — R5383 Other fatigue: Secondary | ICD-10-CM | POA: Diagnosis not present

## 2020-04-02 DIAGNOSIS — R102 Pelvic and perineal pain: Secondary | ICD-10-CM | POA: Diagnosis not present

## 2020-04-02 DIAGNOSIS — N898 Other specified noninflammatory disorders of vagina: Secondary | ICD-10-CM | POA: Diagnosis not present

## 2020-04-03 ENCOUNTER — Other Ambulatory Visit: Payer: Self-pay | Admitting: Obstetrics and Gynecology

## 2020-04-03 DIAGNOSIS — R102 Pelvic and perineal pain: Secondary | ICD-10-CM

## 2020-04-11 ENCOUNTER — Other Ambulatory Visit: Payer: Self-pay

## 2020-04-12 ENCOUNTER — Ambulatory Visit
Admission: RE | Admit: 2020-04-12 | Discharge: 2020-04-12 | Disposition: A | Discharge status: Home / Self Care | Payer: BC Managed Care – PPO | Source: Ambulatory Visit | Attending: Obstetrics and Gynecology | Admitting: Obstetrics and Gynecology

## 2020-04-12 DIAGNOSIS — R102 Pelvic and perineal pain: Secondary | ICD-10-CM

## 2020-04-12 DIAGNOSIS — D259 Leiomyoma of uterus, unspecified: Secondary | ICD-10-CM | POA: Diagnosis not present

## 2020-04-17 DIAGNOSIS — Z01419 Encounter for gynecological examination (general) (routine) without abnormal findings: Secondary | ICD-10-CM | POA: Diagnosis not present

## 2020-04-17 DIAGNOSIS — Z124 Encounter for screening for malignant neoplasm of cervix: Secondary | ICD-10-CM | POA: Diagnosis not present

## 2020-04-17 DIAGNOSIS — Z113 Encounter for screening for infections with a predominantly sexual mode of transmission: Secondary | ICD-10-CM | POA: Diagnosis not present

## 2020-04-17 DIAGNOSIS — Z139 Encounter for screening, unspecified: Secondary | ICD-10-CM | POA: Diagnosis not present

## 2020-05-31 DIAGNOSIS — H6090 Unspecified otitis externa, unspecified ear: Secondary | ICD-10-CM | POA: Diagnosis not present

## 2020-07-23 DIAGNOSIS — Z20822 Contact with and (suspected) exposure to covid-19: Secondary | ICD-10-CM | POA: Diagnosis not present

## 2020-07-26 DIAGNOSIS — N940 Mittelschmerz: Secondary | ICD-10-CM | POA: Diagnosis not present

## 2020-07-26 DIAGNOSIS — R102 Pelvic and perineal pain: Secondary | ICD-10-CM | POA: Diagnosis not present

## 2020-08-23 ENCOUNTER — Other Ambulatory Visit: Payer: Self-pay

## 2020-08-23 ENCOUNTER — Other Ambulatory Visit: Payer: BC Managed Care – PPO

## 2020-08-23 DIAGNOSIS — Z20822 Contact with and (suspected) exposure to covid-19: Secondary | ICD-10-CM

## 2020-08-26 LAB — NOVEL CORONAVIRUS, NAA: SARS-CoV-2, NAA: NOT DETECTED

## 2021-05-29 ENCOUNTER — Other Ambulatory Visit: Payer: Self-pay | Admitting: Obstetrics and Gynecology

## 2021-05-29 DIAGNOSIS — E049 Nontoxic goiter, unspecified: Secondary | ICD-10-CM

## 2021-06-05 ENCOUNTER — Other Ambulatory Visit: Payer: Self-pay | Admitting: Obstetrics and Gynecology

## 2021-06-05 DIAGNOSIS — E049 Nontoxic goiter, unspecified: Secondary | ICD-10-CM

## 2021-06-10 ENCOUNTER — Ambulatory Visit
Admission: RE | Admit: 2021-06-10 | Discharge: 2021-06-10 | Disposition: A | Discharge status: Home / Self Care | Payer: BC Managed Care – PPO | Source: Ambulatory Visit | Attending: Obstetrics and Gynecology | Admitting: Obstetrics and Gynecology

## 2021-06-10 DIAGNOSIS — E049 Nontoxic goiter, unspecified: Secondary | ICD-10-CM

## 2021-12-14 IMAGING — US US PELVIS COMPLETE WITH TRANSVAGINAL
1 series · 13 of 25 positions shown · non-contrast
Comparison: 02/25/2018

CLINICAL DATA: Pelvic pain.  Menometrorrhagia.

EXAM:
TRANSABDOMINAL AND TRANSVAGINAL ULTRASOUND OF PELVIS
TECHNIQUE: Both transabdominal and transvaginal ultrasound examinations of the
pelvis were performed. Transabdominal technique was performed for
global imaging of the pelvis including uterus, ovaries, adnexal
regions, and pelvic cul-de-sac. It was necessary to proceed with
endovaginal exam following the transabdominal exam to visualize the
endometrium and ovaries.

[Series 1: us pelvis complete with transvaginal · 0.22mm/px · 13 of 51 slices shown]
[im 1/51]
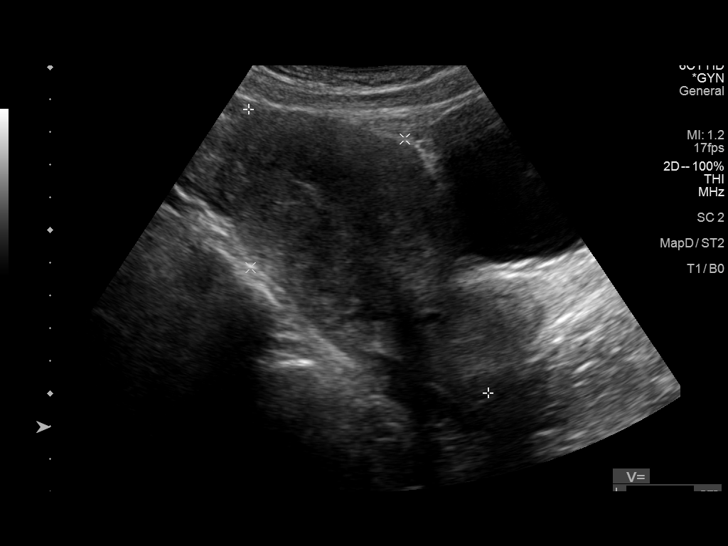
[im 5/51]
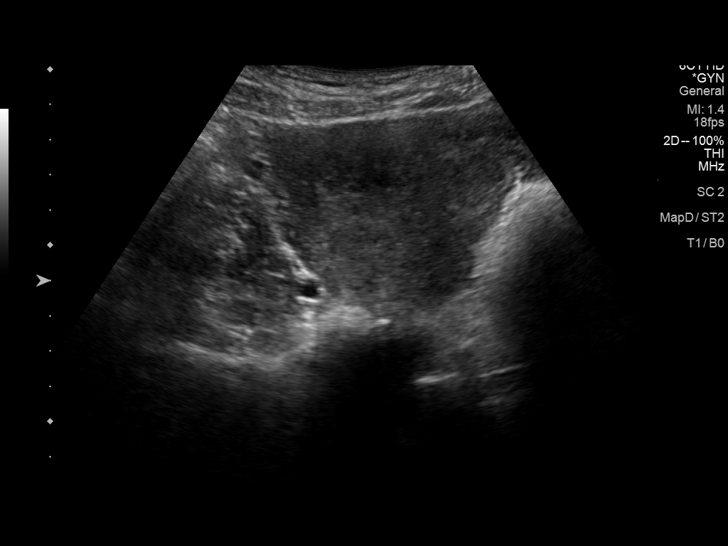
[im 9/51]
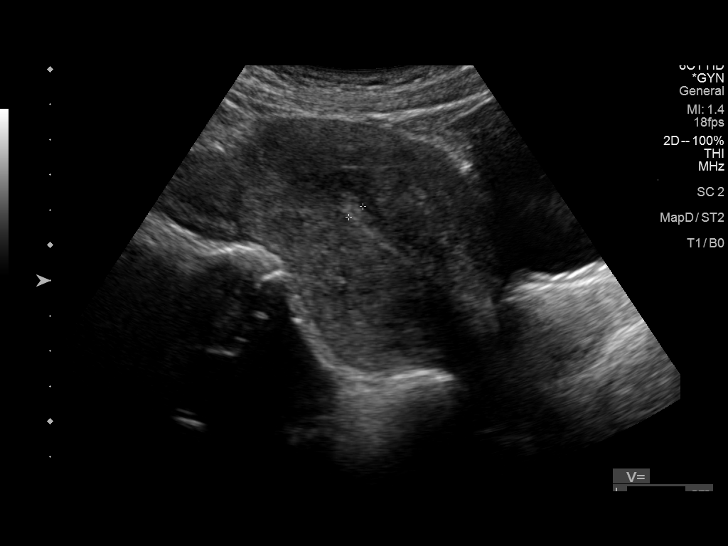
[im 13/51]
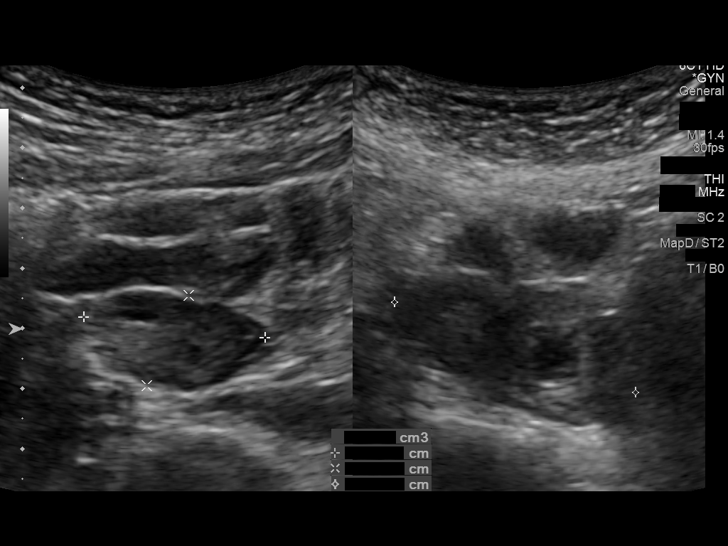
[im 17/51]
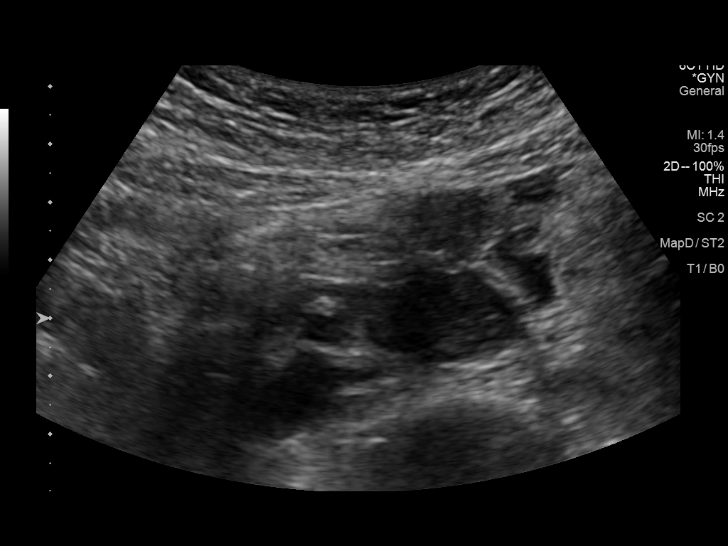
[im 21/51]
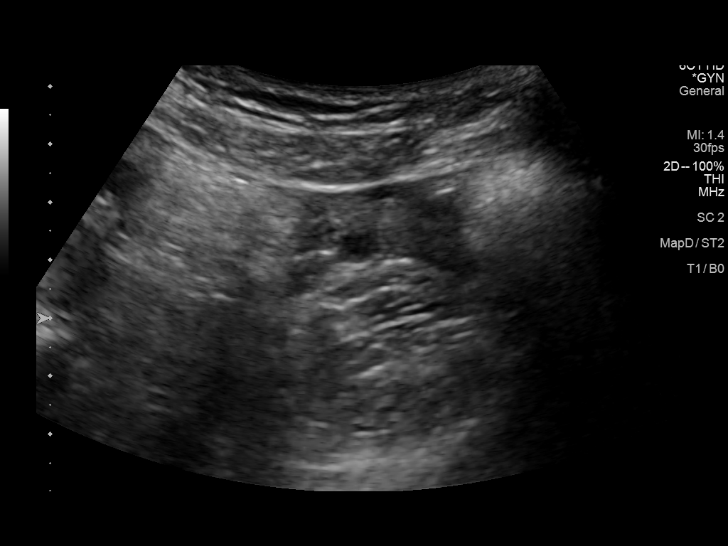
[im 26/51]
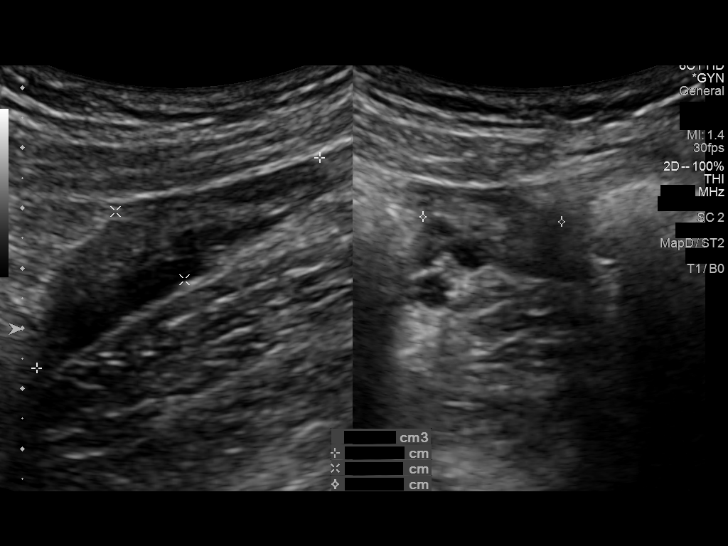
[im 30/51]
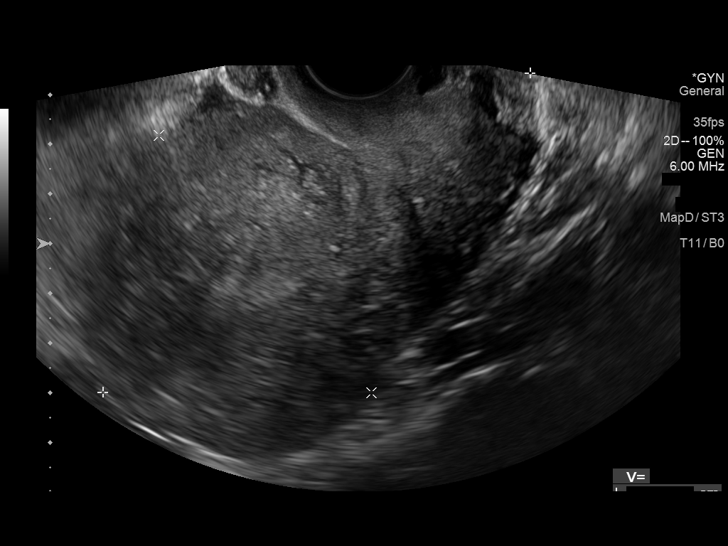
[im 34/51]
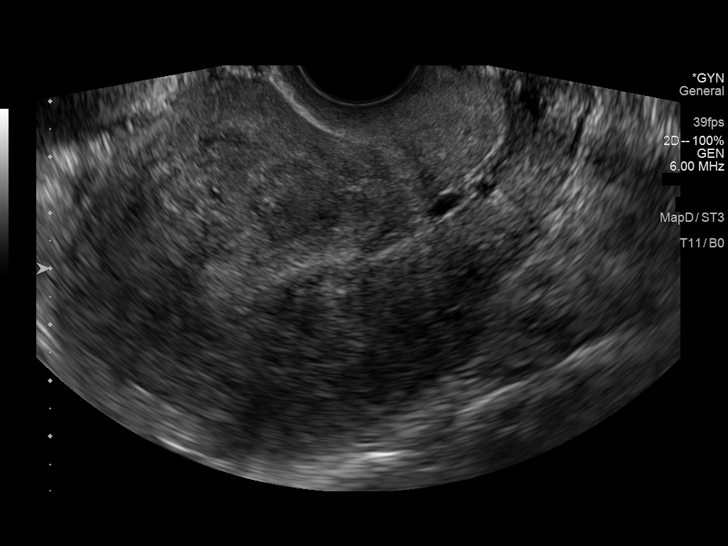
[im 38/51]
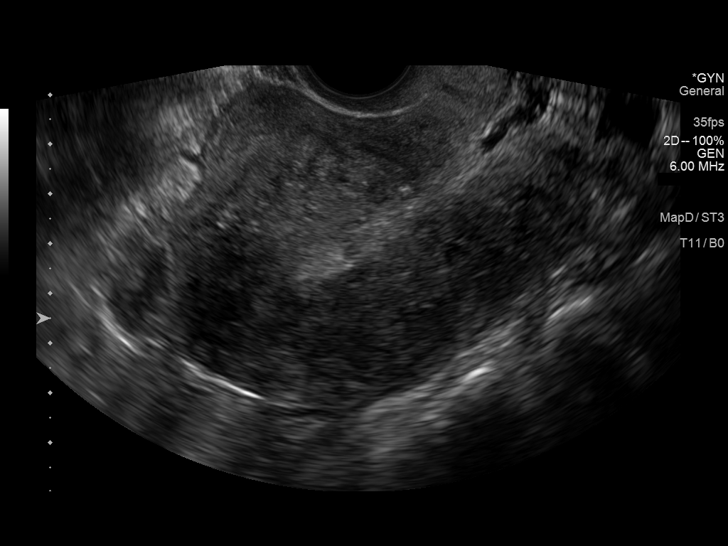
[im 42/51]
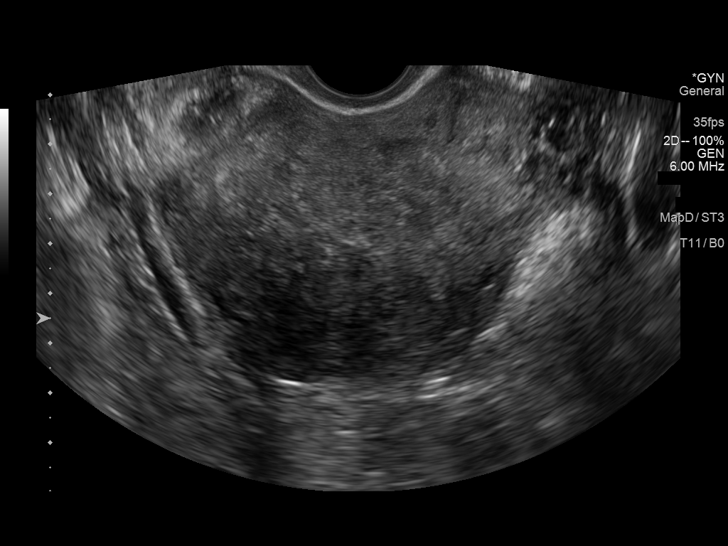
[im 46/51]
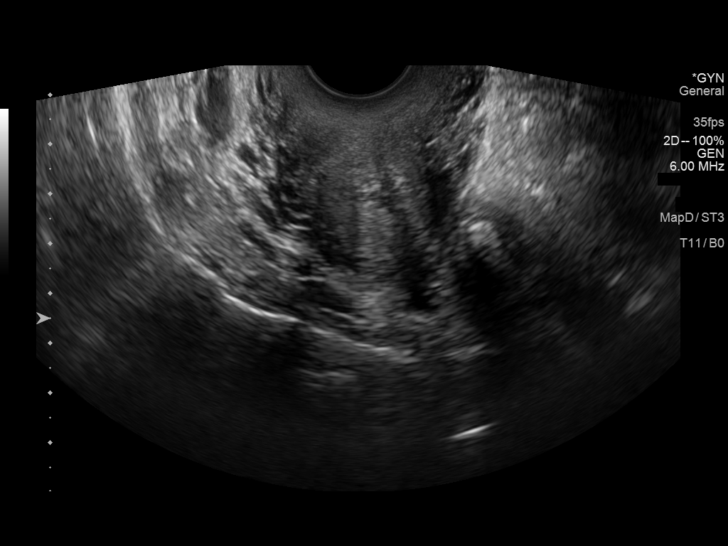
[im 51/51]
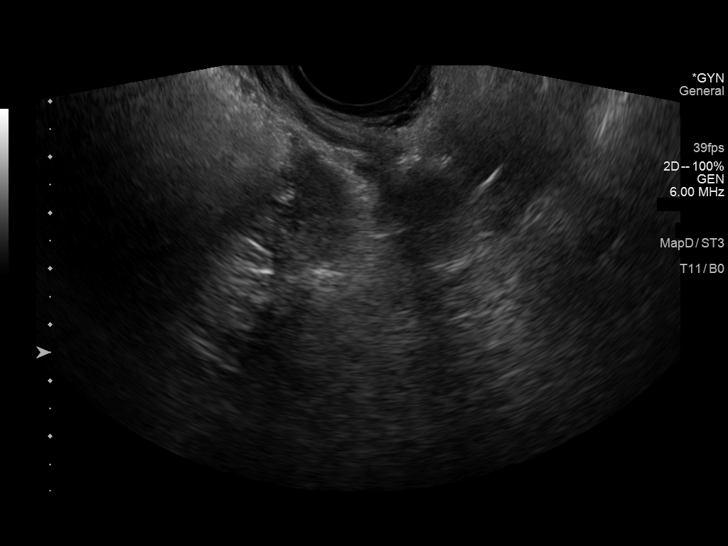

[13 of 25 positions shown; findings below may reference images not displayed]

FINDINGS: Uterus

Measurements: 11.4 x 6.2 x 6.4 cm = volume: 235 mL. Diffusely
heterogeneous echotexture of uterine myometrium again noted. A focal
hypoechoic fibroid is seen in the fundal region which measures
cm, and is not significantly changed in size or appearance since
previous study.

Endometrium

Thickness: 6 mm.  No focal abnormality visualized.

Right ovary

Measurements: 3.0 x 4.7 x 4.3 cm = volume: 11 mL. Normal
appearance/no adnexal mass.

Left ovary

Measurements: 4.9 x 1.6 x 2.3 cm = volume: 11 mL. Normal
appearance/no adnexal mass.

Other findings

No abnormal free fluid.
IMPRESSION: Diffuse uterine adenomyosis and small fundal fibroid, without
significant change.

Normal appearance of both ovaries.  No adnexal pathology identified.

## 2022-07-28 ENCOUNTER — Other Ambulatory Visit: Payer: Self-pay | Admitting: Obstetrics and Gynecology

## 2022-07-28 DIAGNOSIS — E041 Nontoxic single thyroid nodule: Secondary | ICD-10-CM

## 2022-08-04 ENCOUNTER — Ambulatory Visit
Admission: RE | Admit: 2022-08-04 | Discharge: 2022-08-04 | Disposition: A | Discharge status: Home / Self Care | Payer: BC Managed Care – PPO | Source: Ambulatory Visit | Attending: Obstetrics and Gynecology | Admitting: Obstetrics and Gynecology

## 2022-08-04 DIAGNOSIS — E041 Nontoxic single thyroid nodule: Secondary | ICD-10-CM

## 2023-02-11 IMAGING — US US THYROID
1 series · 13 of 25 positions shown · non-contrast
Comparison: 01/09/2010

CLINICAL DATA: 45-year-old female with a history of nontoxic
thyroid goiter

EXAM:
THYROID ULTRASOUND
TECHNIQUE: Ultrasound examination of the thyroid gland and adjacent soft
tissues was performed.

[Series 1: us thyroid · 0.06mm/px · 13 of 51 slices shown]
[im 1/51]
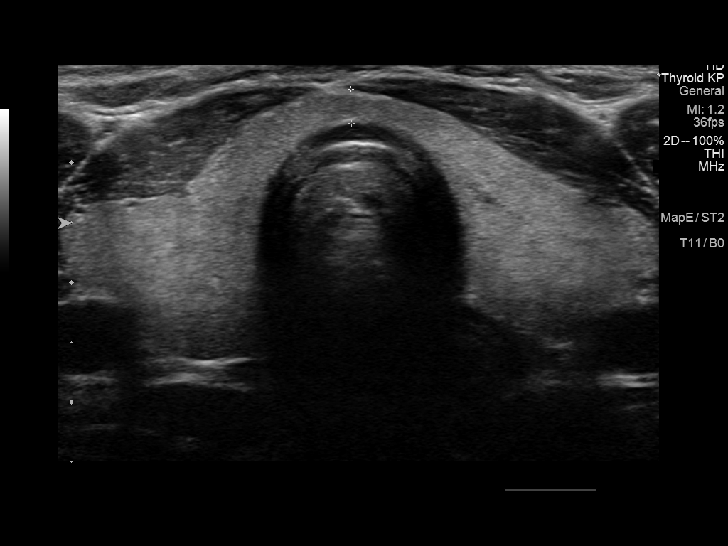
[im 5/51]
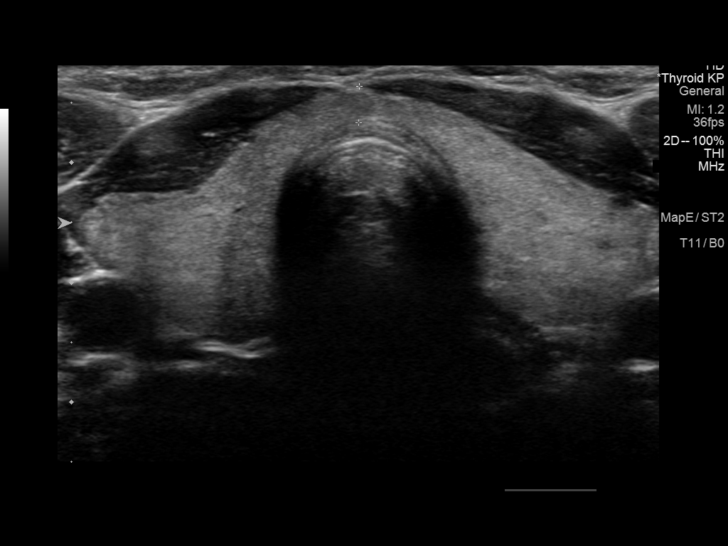
[im 9/51]
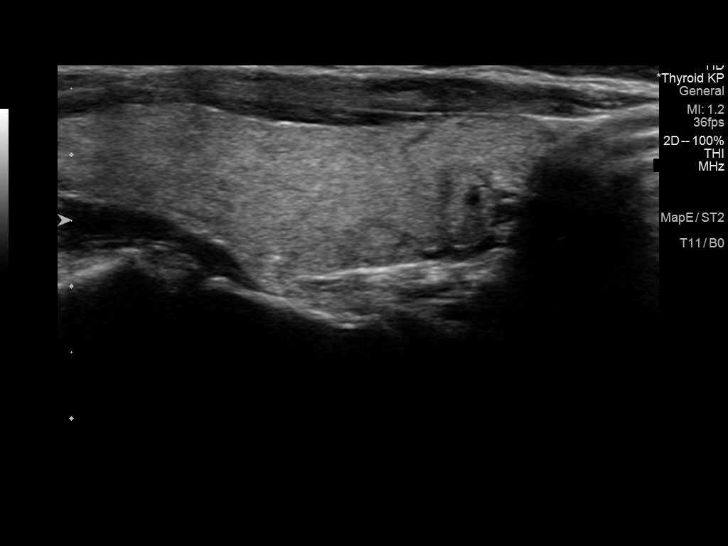
[im 13/51]
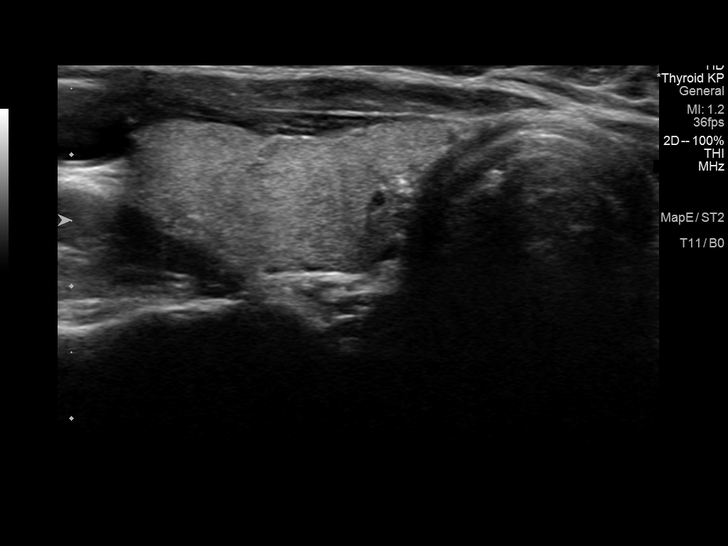
[im 17/51]
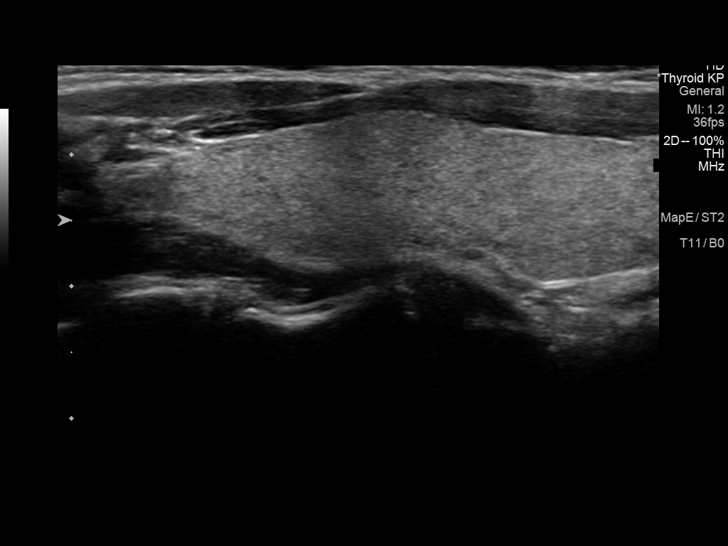
[im 21/51]
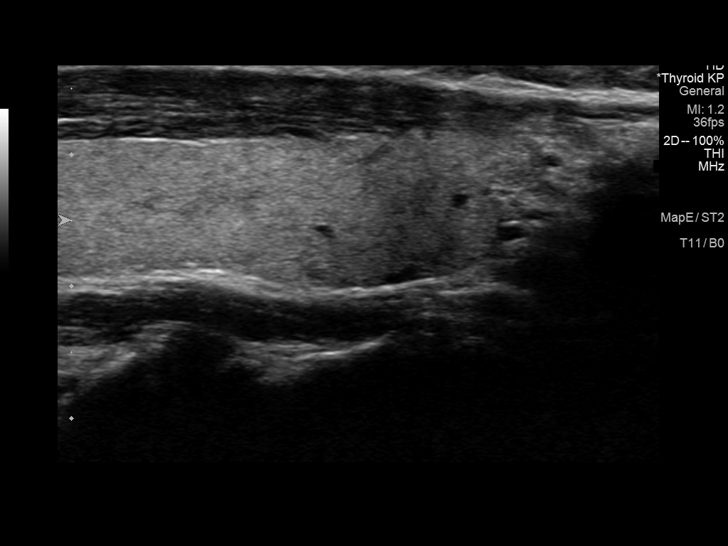
[im 26/51]
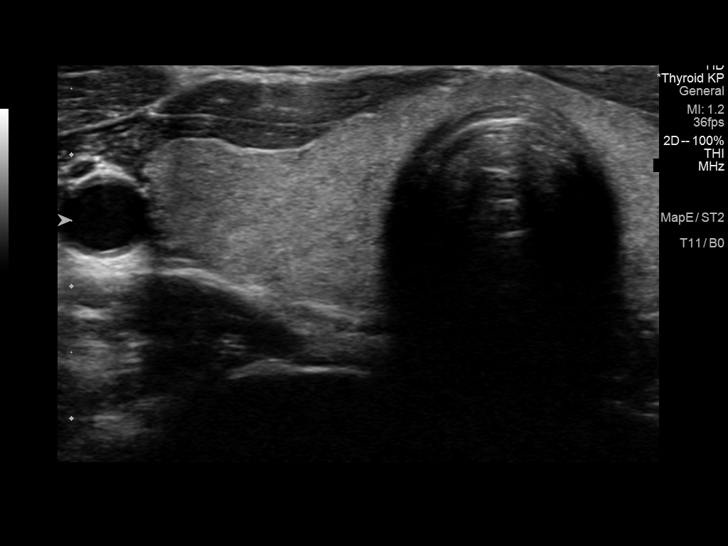
[im 30/51]
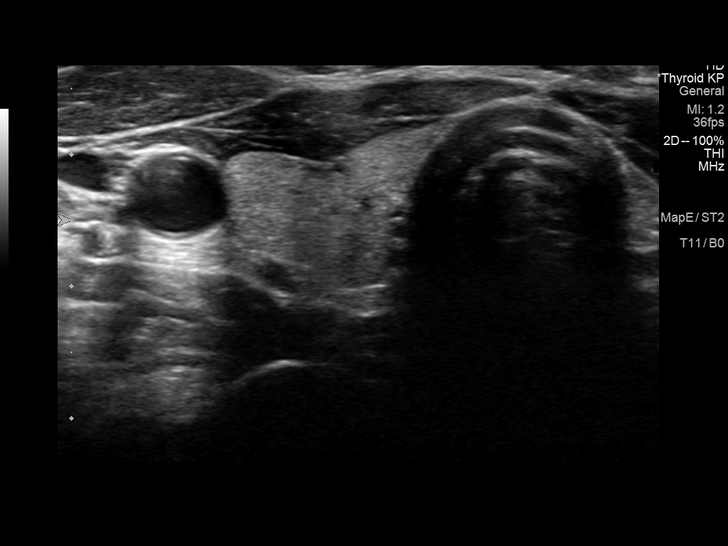
[im 34/51]
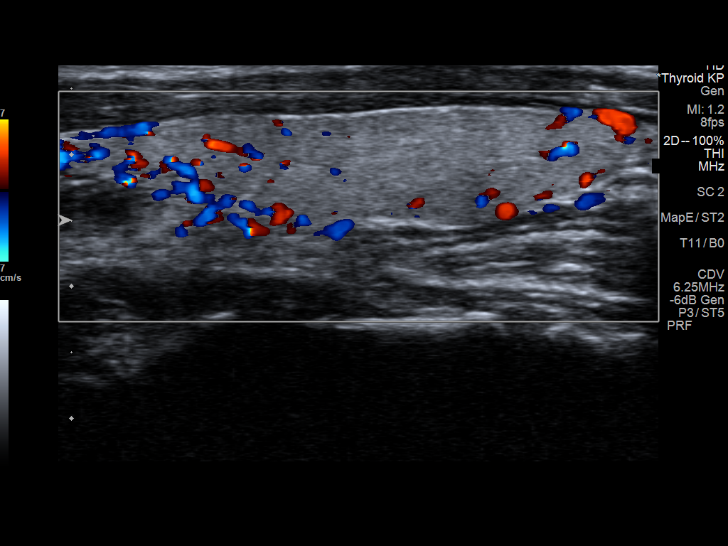
[im 38/51]
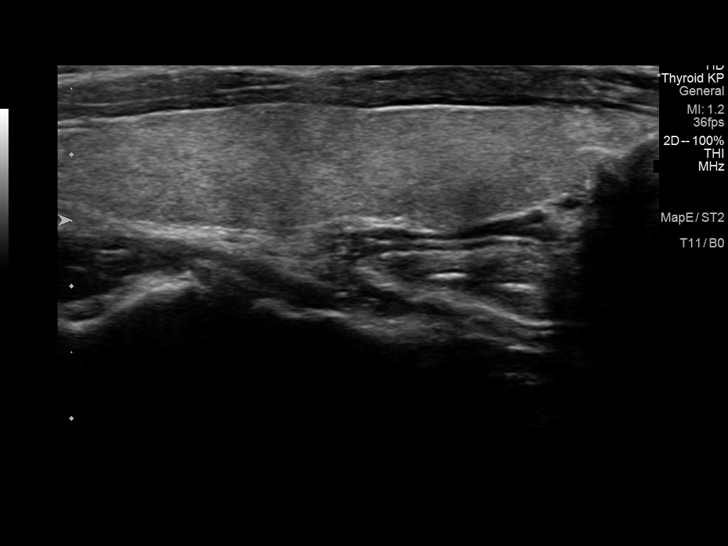
[im 42/51]
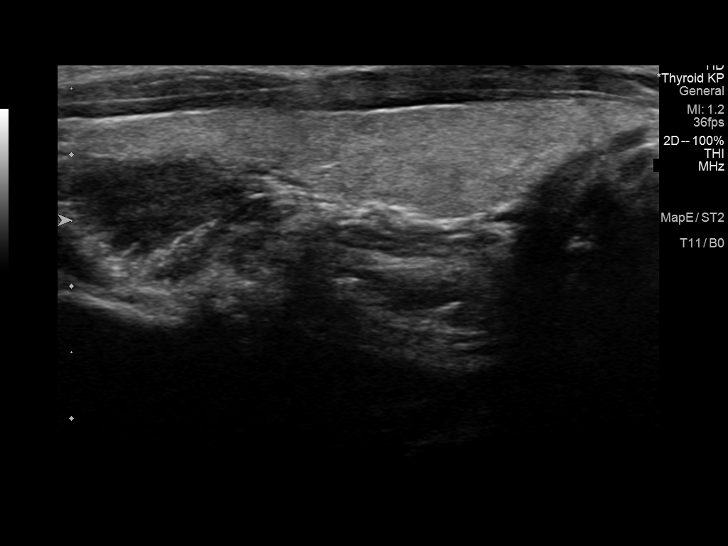
[im 46/51]
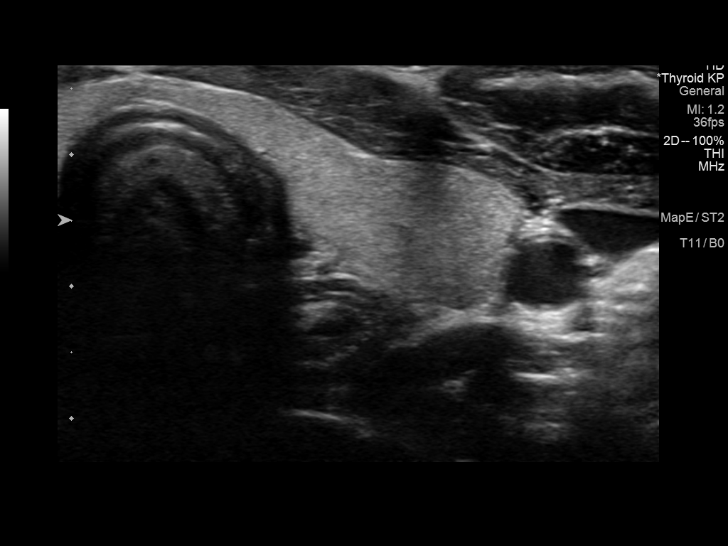
[im 51/51]
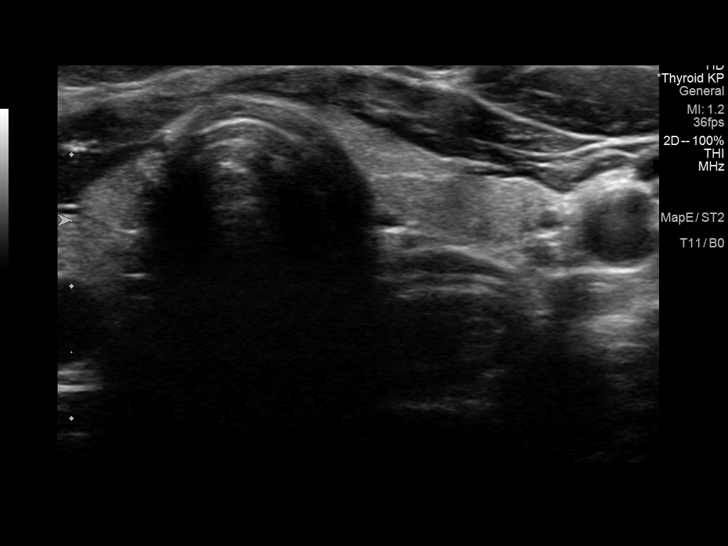

[13 of 25 positions shown; findings below may reference images not displayed]

FINDINGS: Parenchymal Echotexture: Mildly heterogenous

Isthmus: 0.3 cm

Right lobe: 5.0 cm x 1.3 cm x 1.8 cm

Left lobe: 4.6 cm x 1.1 cm x 1.8 cm

_________________________________________________________

Estimated total number of nodules >/= 1 cm: 0

Number of spongiform nodules >/=  2 cm not described below (TR1): 0

Number of mixed cystic and solid nodules >/= 1.5 cm not described
below (TR2): 0

_________________________________________________________

Nodule # 1:

Location: Right; Inferior

Maximum size: 0.6 cm; Other 2 dimensions: 0.5 cm x 0.4 cm

Composition: cannot determine (2)

Echogenicity: hypoechoic (2)

Shape: taller-than-wide (3)

Margins: ill-defined (0)

Echogenic foci: none (0)

ACR TI-RADS total points: 7.

ACR TI-RADS risk category: TR5 (>/= 7 points).

ACR TI-RADS recommendations:

Nodule meets criteria for surveillance

_________________________________________________________

No adenopathy
IMPRESSION: Heterogeneous thyroid may indicate medical thyroid disease.

Right inferior thyroid nodule meets criteria for surveillance, as
designated by the newly established ACR TI-RADS criteria.
Surveillance ultrasound study recommended to be performed annually
up to 5 years.

Recommendations follow those established by the new ACR TI-RADS
criteria ([[facility]OS[facility]] 8034;[DATE]).
